# Patient Record
Sex: Male | Born: 1955 | Race: White | Hispanic: No | Smoking: Former smoker
Health system: Southern US, Community
[De-identification: ages and names within clinical notes are randomized; demographics above are authoritative.]

## PROBLEM LIST (undated history)

## (undated) DIAGNOSIS — M1990 Unspecified osteoarthritis, unspecified site: Secondary | ICD-10-CM

## (undated) DIAGNOSIS — K219 Gastro-esophageal reflux disease without esophagitis: Secondary | ICD-10-CM

## (undated) DIAGNOSIS — R079 Chest pain, unspecified: Secondary | ICD-10-CM

## (undated) DIAGNOSIS — I10 Essential (primary) hypertension: Secondary | ICD-10-CM

## (undated) HISTORY — PX: TONSILLECTOMY: SUR1361

## (undated) HISTORY — PX: ADENOIDECTOMY: SUR15

## (undated) HISTORY — PX: OTHER SURGICAL HISTORY: SHX169

## (undated) HISTORY — DX: Chest pain, unspecified: R07.9

---

## 2007-07-07 ENCOUNTER — Encounter: Admission: RE | Admit: 2007-07-07 | Discharge: 2007-07-07 | Payer: Self-pay | Admitting: Family Medicine

## 2009-01-12 ENCOUNTER — Ambulatory Visit (HOSPITAL_BASED_OUTPATIENT_CLINIC_OR_DEPARTMENT_OTHER): Admission: RE | Admit: 2009-01-12 | Discharge: 2009-01-12 | Payer: Self-pay | Admitting: Orthopedic Surgery

## 2010-02-26 ENCOUNTER — Encounter: Payer: Self-pay | Admitting: Family Medicine

## 2011-05-23 ENCOUNTER — Other Ambulatory Visit: Payer: Self-pay | Admitting: Family Medicine

## 2011-05-23 DIAGNOSIS — R911 Solitary pulmonary nodule: Secondary | ICD-10-CM

## 2011-05-24 ENCOUNTER — Ambulatory Visit
Admission: RE | Admit: 2011-05-24 | Discharge: 2011-05-24 | Disposition: A | Discharge status: Home / Self Care | Payer: BC Managed Care – PPO | Source: Ambulatory Visit | Attending: Family Medicine | Admitting: Family Medicine

## 2011-05-24 DIAGNOSIS — R911 Solitary pulmonary nodule: Secondary | ICD-10-CM

## 2012-07-08 ENCOUNTER — Other Ambulatory Visit: Payer: Self-pay | Admitting: Orthopedic Surgery

## 2012-07-08 MED ORDER — DEXAMETHASONE SODIUM PHOSPHATE 10 MG/ML IJ SOLN: 10.0000 mg | Freq: Once | INTRAMUSCULAR | Status: DC

## 2012-07-08 MED ORDER — BUPIVACAINE LIPOSOME 1.3 % IJ SUSP: 20.0000 mL | Freq: Once | INTRAMUSCULAR | Status: DC

## 2012-07-08 NOTE — Progress Notes (Signed)
Preoperative surgical orders have been place into the Epic hospital system for Sean David on 07/08/2012, 5:39 PM  by Patrica Duel for surgery on 07/22/2012.  Preop Total Hip - Anterior Approach orders including Experel Injecion, PO Tylenol, and IV Decadron as long as there are no contraindications to the above medications. Avel Peace, PA-C

## 2012-07-08 NOTE — Progress Notes (Signed)
Surgery scheduled for 07/22/12.  Preop 07/15/12 at 0900am.  Need orders in EPIC.  Thank You.

## 2012-07-09 ENCOUNTER — Encounter (HOSPITAL_COMMUNITY): Payer: Self-pay | Admitting: Pharmacy Technician

## 2012-07-11 ENCOUNTER — Other Ambulatory Visit: Payer: Self-pay | Admitting: Orthopedic Surgery

## 2012-07-14 NOTE — Patient Instructions (Signed)
Cedrick Partain Son  07/14/2012   Your procedure is scheduled on:  07/22/12  Report to [facility] at     1200noon  Call this number if you have problems the morning of surgery: [phone]   Remember:   Do not eat food after midnite.  May have clear liquids until 0830am .     Take these medicines the morning of surgery with A SIP OF WATER:    Do not wear jewelry,   Do not wear lotions, powders, or perfumes.   . Men may shave face and neck.  Do not bring valuables to the hospital.  Contacts, dentures or bridgework may not be worn into surgery.  Leave suitcase in the car. After surgery it may be brought to your room.  For patients admitted to the hospital, checkout time is 11:00 AM the day of  discharge.     SEE CHG INSTRUCTION SHEET    Please read over the following fact sheets that you were given: MRSA Information, coughing and deep breathing exercises, leg exercises, Blood Transfusion Fact sheet, Incentive Spirometry Fact sheet                Failure to comply with these instructions may result in cancellation of your surgery.                Patient Signature ____________________________              Nurse Signature _____________________________

## 2012-07-15 ENCOUNTER — Encounter (HOSPITAL_COMMUNITY): Payer: Self-pay

## 2012-07-15 ENCOUNTER — Ambulatory Visit (HOSPITAL_COMMUNITY)
Admission: RE | Admit: 2012-07-15 | Discharge: 2012-07-15 | Disposition: A | Discharge status: Home / Self Care | Payer: BC Managed Care – PPO | Source: Ambulatory Visit | Attending: Orthopedic Surgery | Admitting: Orthopedic Surgery

## 2012-07-15 ENCOUNTER — Encounter (HOSPITAL_COMMUNITY)
Admission: RE | Admit: 2012-07-15 | Discharge: 2012-07-15 | Disposition: A | Discharge status: Home / Self Care | Payer: BC Managed Care – PPO | Source: Ambulatory Visit | Attending: Orthopedic Surgery | Admitting: Orthopedic Surgery

## 2012-07-15 DIAGNOSIS — Z01812 Encounter for preprocedural laboratory examination: Secondary | ICD-10-CM | POA: Insufficient documentation

## 2012-07-15 DIAGNOSIS — Z0183 Encounter for blood typing: Secondary | ICD-10-CM | POA: Insufficient documentation

## 2012-07-15 DIAGNOSIS — Z01810 Encounter for preprocedural cardiovascular examination: Secondary | ICD-10-CM | POA: Insufficient documentation

## 2012-07-15 DIAGNOSIS — Z01818 Encounter for other preprocedural examination: Secondary | ICD-10-CM | POA: Insufficient documentation

## 2012-07-15 DIAGNOSIS — M169 Osteoarthritis of hip, unspecified: Secondary | ICD-10-CM | POA: Insufficient documentation

## 2012-07-15 DIAGNOSIS — M1610 Unilateral primary osteoarthritis, unspecified hip: Secondary | ICD-10-CM | POA: Insufficient documentation

## 2012-07-15 HISTORY — DX: Essential (primary) hypertension: I10

## 2012-07-15 HISTORY — DX: Unspecified osteoarthritis, unspecified site: M19.90

## 2012-07-15 HISTORY — DX: Gastro-esophageal reflux disease without esophagitis: K21.9

## 2012-07-15 LAB — URINALYSIS, ROUTINE W REFLEX MICROSCOPIC
Bilirubin Urine: NEGATIVE
Protein, ur: NEGATIVE mg/dL
Urobilinogen, UA: 0.2 mg/dL (ref 0.0–1.0)

## 2012-07-15 LAB — CBC
MCH: 31.4 pg (ref 26.0–34.0)
MCHC: 34.3 g/dL (ref 30.0–36.0)
Platelets: 258 10*3/uL (ref 150–400)
RDW: 13.2 % (ref 11.5–15.5)

## 2012-07-15 LAB — URINE MICROSCOPIC-ADD ON

## 2012-07-15 LAB — COMPREHENSIVE METABOLIC PANEL
ALT: 34 U/L (ref 0–53)
AST: 27 U/L (ref 0–37)
Calcium: 9.5 mg/dL (ref 8.4–10.5)
GFR calc Af Amer: >90 mL/min (ref >90)
Glucose, Bld: 101 mg/dL — ABNORMAL HIGH (ref 70–99)
Sodium: 136 mEq/L (ref 135–145)
Total Protein: 7.8 g/dL (ref 6.0–8.3)

## 2012-07-15 LAB — ABO/RH: ABO/RH(D): AB POS

## 2012-07-15 NOTE — Progress Notes (Signed)
Office visit note from PCP dated 06/13/12 on chart.  CXR results done 06/13/12 discussed in notes and EKG discussed but not EKG 12 lead in office record.  Will obtain 12lead EKG on day of surgery .

## 2012-07-15 NOTE — Progress Notes (Signed)
Urinalysis with micro results faxed via EPIC to Dr Aluisio.   

## 2012-07-21 ENCOUNTER — Other Ambulatory Visit: Payer: Self-pay | Admitting: Orthopedic Surgery

## 2012-07-21 NOTE — H&P (Signed)
Sean David  DOB: 05/23/1955 Divorced / Language: English / Race: White Male  Date of Admission:  07/22/2012  Chief Complaint:  Right Hip Pain  History of Present Illness The patient is a 57 year old male who comes in for a preoperative History and Physical. The patient is scheduled for a right total hip arthroplasty (Anterior Approach) to be performed by Dr. Frank V. Aluisio, MD at [facility] on 07/22/2012. The patient is a 57 year old male who presents with a hip problem. The patient reports right lateral hip and right anterior hip problems including pain symptoms that have been present for year(s). The symptoms began without any known injury. Note for "Hip problem": He saw his urologist a year ago, or more, for blood in his urine. He had an xray of his pelvis and the urologist and he told him that he had OA in his hip at that time. He has been putting it off because he stays so busy. He is interested in the total hip, anterior approach. He also reports right knee symptoms including: pain which began 1 year(s) ago without any known injury. Note for "Knee pain": The knee problem started more recently, about a year or so. He said he is afraid of needles and does not wish to have injections. Sean David, David. states the hip has been hurting for many years now. He feels like it is hereditary. His father has undergone bilateral hip replacements and also his grandfather has bilateral hip replacements. Unfortunately, the first hip replacement his father had he developed a foot drop but did okay with the second hip replacement. That is one of the reasons why the patient has been putting off and has gone a long time in dealing with this pain in the hip area. More recently though the pain has developed in the knee. He was concerned that the hip and the knee were bad. The right hip has started to interfere with his mobility. He had decreased range of motion and it is more  difficult getting up and down steps and putting on shoes and socks. He has had loss of motion and it is starting to impact his active lifestyle. It is of note earlier that the patient had x-rays done at his urologist and was told he had horrible arthritis in the hip. The patient understood that the hip has been a problem and he wanted to come in and finally get it evaluated since the pain has been going on for quite some time now. He is now ready to get the hip fixed. At this point, the most predictable means of improving pain and function is total hip arthroplasty. The procedure, risks, potential complications and rehab course are discussed in detail and the patient elects to proceed. The goals of this procedure are decreased pain and increased function. There is a high liklihood that both of these goals will be achieved. They have been treated conservatively in the past for the above stated problem and despite conservative measures, they continue to have progressive pain and severe functional limitations and dysfunction. They have failed non-operative management including home exercise, medications, and injections. It is felt that they would benefit from undergoing total joint replacement. Risks and benefits of the procedure have been discussed with the patient and they elect to proceed with surgery. There are no active contraindications to surgery such as ongoing infection or rapidly progressive neurological disease.   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 he is not allergic to it, he just does not ever want it   Family History  Osteoarthritis. mother Osteoporosis. mother Cerebrovascular Accident. father Diabetes Mellitus. brother and grandmother mothers side   Social History Living situation. live alone Marital status. divorced Number of flights of stairs before winded.  greater than 5 Drug/Alcohol Rehab (Previously). no Exercise. Exercises rarely; does running / walking Illicit drug use. no Pain Contract. no Tobacco / smoke exposure. no Tobacco use. former smoker; smoke(d) 1 pack(s) per day Children. 0 Current work status. retired Drug/Alcohol Rehab (Currently). no Alcohol use. current drinker; drinks beer; 5-7 per week   Medication History Lansoprazole (30MG Capsule DR, Oral) Active. Nadolol (40MG Tablet, Oral) Active. Pravastatin Sodium (40MG Tablet, Oral) Active. Clindamycin HCl (150MG Capsule, Oral) Active. (For Dental Work)   Past Surgical History Tonsillectomy   Medical History Gastroesophageal Reflux Disease High blood pressure Hypercholesterolemia Measles Mumps   Review of Systems  General:Present- Weight Gain. Not Present- Chills, Fever, Night Sweats, Appetite Loss, Fatigue, Feeling sick and Weight Loss. HEENT:Not Present- Sensitivity to light, Hearing problems, Nose Bleed and Ringing in the Ears. Neck:Not Present- Swollen Glands and Neck Mass. Respiratory:Not Present- Snoring, Chronic Cough, Bloody sputum and Dyspnea. Cardiovascular:Not Present- Shortness of Breath, Chest Pain, Swelling of Extremities, Leg Cramps and Palpitations. Gastrointestinal:Present- Heartburn. Not Present- Bloody Stool, Abdominal Pain, Vomiting, Nausea and Incontinence of Stool. Male Genitourinary:Not Present- Blood in Urine, Frequency, Incontinence and Nocturia. Musculoskeletal:Present- Muscle Pain, Joint Stiffness and Joint Pain. Not Present- Muscle Weakness, Joint Swelling and Back Pain. Psychiatric:Not Present- Anxiety, Depression and Memory Loss. Endocrine:Not Present- Cold Intolerance, Heat Intolerance, Excessive hunger and Excessive Thirst. Hematology:Not Present- Abnormal Bleeding, Anemia, Blood Clots and Easy Bruising.   Vitals Weight: 240 lb Height: 73 in Weight was reported by patient. Height was reported by  patient. Body Surface Area: 2.37 m Body Mass Index: 31.66 kg/m Pulse: 60 (Regular) Resp.: 14 (Unlabored) BP: 138/88 (Sitting, Right Arm, Standard)    Physical Exam The physical exam findings are as follows:  Note: Patient is a 57 year old male with continued hip pain.   General Mental Status - Alert, cooperative and good historian. General Appearance- pleasant. Not in acute distress. Orientation- Oriented X3. Build & Nutrition- Well nourished and Well developed.   Head and Neck Head- normocephalic, atraumatic . Neck Global Assessment- supple. no bruit auscultated on the right and no bruit auscultated on the left.   Eye Pupil- Bilateral- Regular and Round. Motion- Bilateral- EOMI.   Chest and Lung Exam Auscultation: Breath sounds:- clear at anterior chest wall and - clear at posterior chest wall. Adventitious sounds:- No Adventitious sounds.   Cardiovascular Auscultation:Rhythm- Regular rate and rhythm. Heart Sounds- S1 WNL and S2 WNL. Murmurs & Other Heart Sounds:Auscultation of the heart reveals - No Murmurs.   Abdomen Inspection:Contour- Generalized mild distention. Palpation/Percussion:Tenderness- Abdomen is non-tender to palpation. Rigidity (guarding)- Abdomen is soft. Auscultation:Auscultation of the abdomen reveals - Bowel sounds normal.   Male Genitourinary Not done, not pertinent to present illness  Musculoskeletal The patient is a 57 -year-old white male, well nourished, well developed in no acute distress, getting off and on the examination table a little slow with his hip.  Examination of both hips, the opposite side left hip shows flexion 130 degrees, internal rotation 10 degrees, external rotation 40 degrees, abduction 45 degrees. He is a little bit limited on the internal rotation otherwise the left side is moving well. Right   hip, reduced flexion of only 110 degrees, zero internal rotation, about 20 degrees  of external rotation, about 30 degrees of abduction, significantly less range of motion. He has some pain noted on attempt at internal rotation which is zero degrees.  Examination of the right knee, he is slightly tender over the lateral joint line. The knee is stable with valgus varus stressing. Passive range of motion is zero to 120 degrees, actively 125 degrees. No effusion. No crepitus. The knee is stable. Extensor mechanism is intact.  RADIOGRAPHS: AP pelvis and lateral view right hip. AP view shows significant dysplasia noted in the right hip with complete loss of the superior joint space in the femoral acetabular joint. He also has flattening of the femoral head with some cystic changes noted. The lateral view confirms the cystic changes in the femoral acetabular region. He has large spurs coming off of the anterior acetabular and also the inferior femoral head.  AP and lateral view right knee show some slight early narrowing of the medial compartment of the right knee otherwise no significant bony abnormalities are appreciated.  Assessment & Plan  Osteoarthrosis of hip (715.35) Impression: Right Hip  Note: Plan is for a Right Total Hip Replacement - Anterior Approach by Dr. Aluisio.  Plan is to go home.  The patient does not have any contraindications and will recieve TXA (tranexamic acid) prior to surgery.  PCP - Dr. Jones ([facility])  Signed electronically by Alexzandrew L Perkins, III PA-C  

## 2012-07-22 ENCOUNTER — Inpatient Hospital Stay (HOSPITAL_COMMUNITY): Payer: BC Managed Care – PPO | Admitting: Anesthesiology

## 2012-07-22 ENCOUNTER — Inpatient Hospital Stay (HOSPITAL_COMMUNITY): Payer: BC Managed Care – PPO

## 2012-07-22 ENCOUNTER — Encounter (HOSPITAL_COMMUNITY)
Admission: RE | Disposition: A | Discharge status: Home Health | Payer: Self-pay | Source: Ambulatory Visit | Attending: Orthopedic Surgery

## 2012-07-22 ENCOUNTER — Encounter (HOSPITAL_COMMUNITY): Payer: Self-pay | Admitting: *Deleted

## 2012-07-22 ENCOUNTER — Inpatient Hospital Stay (HOSPITAL_COMMUNITY)
Admission: RE | Admit: 2012-07-22 | Discharge: 2012-07-24 | DRG: 818 | Disposition: A | Discharge status: Home Health | Payer: BC Managed Care – PPO | Source: Ambulatory Visit | Attending: Orthopedic Surgery | Admitting: Orthopedic Surgery

## 2012-07-22 ENCOUNTER — Encounter (HOSPITAL_COMMUNITY): Payer: Self-pay | Admitting: Anesthesiology

## 2012-07-22 DIAGNOSIS — Z6832 Body mass index (BMI) 32.0-32.9, adult: Secondary | ICD-10-CM | POA: Diagnosis present

## 2012-07-22 DIAGNOSIS — M169 Osteoarthritis of hip, unspecified: Secondary | ICD-10-CM | POA: Diagnosis present

## 2012-07-22 DIAGNOSIS — K219 Gastro-esophageal reflux disease without esophagitis: Secondary | ICD-10-CM | POA: Diagnosis present

## 2012-07-22 DIAGNOSIS — D62 Acute posthemorrhagic anemia: Secondary | ICD-10-CM

## 2012-07-22 DIAGNOSIS — I10 Essential (primary) hypertension: Secondary | ICD-10-CM | POA: Diagnosis present

## 2012-07-22 DIAGNOSIS — M1610 Unilateral primary osteoarthritis, unspecified hip: Principal | ICD-10-CM | POA: Diagnosis present

## 2012-07-22 DIAGNOSIS — E7800 Pure hypercholesterolemia, unspecified: Secondary | ICD-10-CM | POA: Diagnosis present

## 2012-07-22 DIAGNOSIS — Z79899 Other long term (current) drug therapy: Secondary | ICD-10-CM

## 2012-07-22 DIAGNOSIS — Z96649 Presence of unspecified artificial hip joint: Secondary | ICD-10-CM

## 2012-07-22 HISTORY — PX: TOTAL HIP ARTHROPLASTY: SHX124

## 2012-07-22 LAB — TYPE AND SCREEN
ABO/RH(D): AB POS
Antibody Screen: NEGATIVE

## 2012-07-22 SURGERY — ARTHROPLASTY, HIP, TOTAL, ANTERIOR APPROACH
Anesthesia: General | Site: Hip | Laterality: Right | Wound class: Clean

## 2012-07-22 MED ORDER — LACTATED RINGERS IV SOLN
INTRAVENOUS | Status: DC | PRN
  Administered 2012-07-22 (×3): via INTRAVENOUS

## 2012-07-22 MED ORDER — HYDROMORPHONE HCL PF 1 MG/ML IJ SOLN
0.5000 mg | INTRAMUSCULAR | Status: DC | PRN
  Administered 2012-07-22: 1 mg via INTRAVENOUS
  Administered 2012-07-22: 0.5 mg via INTRAVENOUS
  Filled 2012-07-22 (×2): qty 1

## 2012-07-22 MED ORDER — CEFAZOLIN SODIUM-DEXTROSE 2-3 GM-% IV SOLR
INTRAVENOUS | Status: AC
  Filled 2012-07-22: qty 50

## 2012-07-22 MED ORDER — POLYETHYLENE GLYCOL 3350 17 G PO PACK
17.0000 g | PACK | Freq: Every day | ORAL | Status: DC | PRN
  Administered 2012-07-24: 17 g via ORAL

## 2012-07-22 MED ORDER — FENTANYL CITRATE 0.05 MG/ML IJ SOLN
INTRAMUSCULAR | Status: DC | PRN
  Administered 2012-07-22: 50 ug via INTRAVENOUS
  Administered 2012-07-22: 75 ug via INTRAVENOUS
  Administered 2012-07-22: 100 ug via INTRAVENOUS
  Administered 2012-07-22: 50 ug via INTRAVENOUS
  Administered 2012-07-22: 75 ug via INTRAVENOUS
  Administered 2012-07-22: 50 ug via INTRAVENOUS

## 2012-07-22 MED ORDER — DOCUSATE SODIUM 100 MG PO CAPS
100.0000 mg | ORAL_CAPSULE | Freq: Two times a day (BID) | ORAL | Status: DC
  Administered 2012-07-22 – 2012-07-24 (×4): 100 mg via ORAL

## 2012-07-22 MED ORDER — KETOROLAC TROMETHAMINE 15 MG/ML IJ SOLN
15.0000 mg | Freq: Four times a day (QID) | INTRAMUSCULAR | Status: DC | PRN
  Administered 2012-07-22: 15 mg via INTRAVENOUS

## 2012-07-22 MED ORDER — MIDAZOLAM HCL 2 MG/2ML IJ SOLN: 0.5000 mg | Freq: Once | INTRAMUSCULAR | Status: DC | PRN

## 2012-07-22 MED ORDER — CHLORHEXIDINE GLUCONATE 4 % EX LIQD
60.0000 mL | Freq: Once | CUTANEOUS | Status: DC
  Filled 2012-07-22: qty 60

## 2012-07-22 MED ORDER — BUPIVACAINE HCL (PF) 0.25 % IJ SOLN
INTRAMUSCULAR | Status: AC
  Filled 2012-07-22: qty 30

## 2012-07-22 MED ORDER — ACETAMINOPHEN 500 MG PO TABS: 1000.0000 mg | ORAL_TABLET | Freq: Once | ORAL | Status: DC

## 2012-07-22 MED ORDER — OXYCODONE HCL 5 MG PO TABS
5.0000 mg | ORAL_TABLET | ORAL | Status: DC | PRN
  Administered 2012-07-22: 5 mg via ORAL
  Administered 2012-07-22 – 2012-07-23 (×3): 10 mg via ORAL
  Administered 2012-07-24: 5 mg via ORAL
  Filled 2012-07-22 (×3): qty 2
  Filled 2012-07-22 (×2): qty 1

## 2012-07-22 MED ORDER — ACETAMINOPHEN 10 MG/ML IV SOLN
INTRAVENOUS | Status: AC
  Filled 2012-07-22: qty 100

## 2012-07-22 MED ORDER — BUPIVACAINE LIPOSOME 1.3 % IJ SUSP
20.0000 mL | Freq: Once | INTRAMUSCULAR | Status: DC
  Filled 2012-07-22: qty 20

## 2012-07-22 MED ORDER — PHENOL 1.4 % MT LIQD: 1.0000 | OROMUCOSAL | Status: DC | PRN

## 2012-07-22 MED ORDER — LIDOCAINE HCL (CARDIAC) 20 MG/ML IV SOLN
INTRAVENOUS | Status: DC | PRN
  Administered 2012-07-22: 30 mg via INTRAVENOUS

## 2012-07-22 MED ORDER — HYDROMORPHONE HCL PF 1 MG/ML IJ SOLN
0.2500 mg | INTRAMUSCULAR | Status: DC | PRN
  Administered 2012-07-22 (×4): 0.5 mg via INTRAVENOUS

## 2012-07-22 MED ORDER — SODIUM CHLORIDE 0.9 % IV SOLN
INTRAVENOUS | Status: DC
  Administered 2012-07-23: 03:00:00 via INTRAVENOUS

## 2012-07-22 MED ORDER — 0.9 % SODIUM CHLORIDE (POUR BTL) OPTIME
TOPICAL | Status: DC | PRN
  Administered 2012-07-22: 1000 mL

## 2012-07-22 MED ORDER — MIDAZOLAM HCL 5 MG/5ML IJ SOLN
INTRAMUSCULAR | Status: DC | PRN
  Administered 2012-07-22: 1 mg via INTRAVENOUS

## 2012-07-22 MED ORDER — KETOROLAC TROMETHAMINE 15 MG/ML IJ SOLN
INTRAMUSCULAR | Status: AC
  Filled 2012-07-22: qty 1

## 2012-07-22 MED ORDER — LACTATED RINGERS IV SOLN
INTRAVENOUS | Status: DC
  Administered 2012-07-22: 1000 mL via INTRAVENOUS

## 2012-07-22 MED ORDER — ONDANSETRON HCL 4 MG/2ML IJ SOLN
INTRAMUSCULAR | Status: DC | PRN
  Administered 2012-07-22 (×2): 2 mg via INTRAVENOUS

## 2012-07-22 MED ORDER — MENTHOL 3 MG MT LOZG: 1.0000 | LOZENGE | OROMUCOSAL | Status: DC | PRN

## 2012-07-22 MED ORDER — DEXAMETHASONE 6 MG PO TABS
10.0000 mg | ORAL_TABLET | Freq: Every day | ORAL | Status: AC
  Administered 2012-07-23: 10 mg via ORAL
  Filled 2012-07-22: qty 1

## 2012-07-22 MED ORDER — GLYCOPYRROLATE 0.2 MG/ML IJ SOLN
INTRAMUSCULAR | Status: DC | PRN
  Administered 2012-07-22: 0.3 mg via INTRAVENOUS
  Administered 2012-07-22: 0.1 mg via INTRAVENOUS

## 2012-07-22 MED ORDER — METHOCARBAMOL 500 MG PO TABS
500.0000 mg | ORAL_TABLET | Freq: Four times a day (QID) | ORAL | Status: DC | PRN
  Administered 2012-07-22: 500 mg via ORAL
  Filled 2012-07-22: qty 1

## 2012-07-22 MED ORDER — RIVAROXABAN 10 MG PO TABS
10.0000 mg | ORAL_TABLET | Freq: Every day | ORAL | Status: DC
  Administered 2012-07-23 – 2012-07-24 (×2): 10 mg via ORAL
  Filled 2012-07-22 (×3): qty 1

## 2012-07-22 MED ORDER — NADOLOL 40 MG PO TABS
40.0000 mg | ORAL_TABLET | Freq: Every morning | ORAL | Status: DC
  Administered 2012-07-24: 40 mg via ORAL
  Filled 2012-07-22 (×2): qty 1

## 2012-07-22 MED ORDER — EPHEDRINE SULFATE 50 MG/ML IJ SOLN
INTRAMUSCULAR | Status: DC | PRN
  Administered 2012-07-22: 15 mg via INTRAVENOUS
  Administered 2012-07-22 (×2): 10 mg via INTRAVENOUS
  Administered 2012-07-22: 15 mg via INTRAVENOUS
  Administered 2012-07-22: 10 mg via INTRAVENOUS
  Administered 2012-07-22: 15 mg via INTRAVENOUS

## 2012-07-22 MED ORDER — BUPIVACAINE HCL 0.25 % IJ SOLN
INTRAMUSCULAR | Status: DC | PRN
  Administered 2012-07-22: 20 mL

## 2012-07-22 MED ORDER — PROPOFOL 10 MG/ML IV BOLUS
INTRAVENOUS | Status: DC | PRN
  Administered 2012-07-22: 200 mg via INTRAVENOUS

## 2012-07-22 MED ORDER — LACTATED RINGERS IV SOLN
INTRAVENOUS | Status: DC
  Administered 2012-07-22: 17:00:00 via INTRAVENOUS

## 2012-07-22 MED ORDER — DIPHENHYDRAMINE HCL 12.5 MG/5ML PO ELIX
12.5000 mg | ORAL_SOLUTION | ORAL | Status: DC | PRN
  Administered 2012-07-23: 12.5 mg via ORAL
  Filled 2012-07-22: qty 5

## 2012-07-22 MED ORDER — ACETAMINOPHEN 650 MG RE SUPP
650.0000 mg | Freq: Four times a day (QID) | RECTAL | Status: DC
  Filled 2012-07-22 (×8): qty 1

## 2012-07-22 MED ORDER — BISACODYL 10 MG RE SUPP: 10.0000 mg | Freq: Every day | RECTAL | Status: DC | PRN

## 2012-07-22 MED ORDER — SODIUM CHLORIDE 0.9 % IV SOLN: INTRAVENOUS | Status: DC

## 2012-07-22 MED ORDER — PROMETHAZINE HCL 25 MG/ML IJ SOLN: 6.2500 mg | INTRAMUSCULAR | Status: DC | PRN

## 2012-07-22 MED ORDER — TRANEXAMIC ACID 100 MG/ML IV SOLN
1000.0000 mg | INTRAVENOUS | Status: AC
  Administered 2012-07-22: 1000 mg via INTRAVENOUS
  Filled 2012-07-22: qty 10

## 2012-07-22 MED ORDER — SUCCINYLCHOLINE CHLORIDE 20 MG/ML IJ SOLN
INTRAMUSCULAR | Status: DC | PRN
  Administered 2012-07-22: 180 mg via INTRAVENOUS

## 2012-07-22 MED ORDER — MIDAZOLAM HCL 2 MG/2ML IJ SOLN
INTRAMUSCULAR | Status: AC
  Administered 2012-07-22: 1 mg
  Filled 2012-07-22: qty 2

## 2012-07-22 MED ORDER — HYDROMORPHONE HCL PF 1 MG/ML IJ SOLN
INTRAMUSCULAR | Status: AC
  Filled 2012-07-22: qty 1

## 2012-07-22 MED ORDER — ONDANSETRON HCL 4 MG/2ML IJ SOLN: 4.0000 mg | Freq: Four times a day (QID) | INTRAMUSCULAR | Status: DC | PRN

## 2012-07-22 MED ORDER — TRAMADOL HCL 50 MG PO TABS
50.0000 mg | ORAL_TABLET | Freq: Four times a day (QID) | ORAL | Status: DC | PRN
Start: 2012-07-22 — End: 2012-07-24

## 2012-07-22 MED ORDER — DEXAMETHASONE SODIUM PHOSPHATE 10 MG/ML IJ SOLN
10.0000 mg | Freq: Every day | INTRAMUSCULAR | Status: AC
  Filled 2012-07-22: qty 1

## 2012-07-22 MED ORDER — CEFAZOLIN SODIUM-DEXTROSE 2-3 GM-% IV SOLR
2.0000 g | INTRAVENOUS | Status: AC
  Administered 2012-07-22: 2 g via INTRAVENOUS

## 2012-07-22 MED ORDER — METOCLOPRAMIDE HCL 5 MG/ML IJ SOLN
5.0000 mg | Freq: Three times a day (TID) | INTRAMUSCULAR | Status: DC | PRN
Start: 2012-07-22 — End: 2012-07-24

## 2012-07-22 MED ORDER — ACETAMINOPHEN 500 MG PO TABS
1000.0000 mg | ORAL_TABLET | Freq: Four times a day (QID) | ORAL | Status: DC
  Administered 2012-07-22 – 2012-07-24 (×7): 1000 mg via ORAL
  Filled 2012-07-22 (×12): qty 2

## 2012-07-22 MED ORDER — ONDANSETRON HCL 4 MG PO TABS: 4.0000 mg | ORAL_TABLET | Freq: Four times a day (QID) | ORAL | Status: DC | PRN

## 2012-07-22 MED ORDER — PHENYLEPHRINE HCL 10 MG/ML IJ SOLN
INTRAMUSCULAR | Status: DC | PRN
  Administered 2012-07-22 (×2): 20 ug via INTRAVENOUS

## 2012-07-22 MED ORDER — SODIUM CHLORIDE 0.9 % IJ SOLN
INTRAMUSCULAR | Status: DC | PRN
  Administered 2012-07-22: 30 mL via INTRAVENOUS

## 2012-07-22 MED ORDER — CEFAZOLIN SODIUM-DEXTROSE 2-3 GM-% IV SOLR
2.0000 g | Freq: Four times a day (QID) | INTRAVENOUS | Status: AC
  Administered 2012-07-22 – 2012-07-23 (×2): 2 g via INTRAVENOUS
  Filled 2012-07-22 (×2): qty 50

## 2012-07-22 MED ORDER — FLEET ENEMA 7-19 GM/118ML RE ENEM: 1.0000 | ENEMA | Freq: Once | RECTAL | Status: AC | PRN

## 2012-07-22 MED ORDER — CISATRACURIUM BESYLATE (PF) 10 MG/5ML IV SOLN
INTRAVENOUS | Status: DC | PRN
  Administered 2012-07-22: 10 mg via INTRAVENOUS

## 2012-07-22 MED ORDER — METHOCARBAMOL 100 MG/ML IJ SOLN
500.0000 mg | Freq: Four times a day (QID) | INTRAVENOUS | Status: DC | PRN
  Administered 2012-07-22: 500 mg via INTRAVENOUS
  Filled 2012-07-22 (×2): qty 5

## 2012-07-22 MED ORDER — ACETAMINOPHEN 500 MG PO TABS
ORAL_TABLET | ORAL | Status: AC
  Administered 2012-07-22: 1000 mg
  Filled 2012-07-22: qty 2

## 2012-07-22 MED ORDER — KETAMINE HCL 10 MG/ML IJ SOLN
INTRAMUSCULAR | Status: DC | PRN
  Administered 2012-07-22: 20 mg via INTRAVENOUS

## 2012-07-22 MED ORDER — METOCLOPRAMIDE HCL 10 MG PO TABS: 5.0000 mg | ORAL_TABLET | Freq: Three times a day (TID) | ORAL | Status: DC | PRN

## 2012-07-22 MED ORDER — PANTOPRAZOLE SODIUM 40 MG PO TBEC
40.0000 mg | DELAYED_RELEASE_TABLET | Freq: Every day | ORAL | Status: DC
  Filled 2012-07-22: qty 1

## 2012-07-22 MED ORDER — BUPIVACAINE LIPOSOME 1.3 % IJ SUSP
INTRAMUSCULAR | Status: DC | PRN
  Administered 2012-07-22: 20 mL

## 2012-07-22 SURGICAL SUPPLY — 41 items
BAG ZIPLOCK 12X15 (MISCELLANEOUS) ×2 IMPLANT
BLADE SAW SGTL 18X1.27X75 (BLADE) ×2 IMPLANT
CAPT HIP PF COP ×2 IMPLANT
CLOTH BEACON ORANGE TIMEOUT ST (SAFETY) ×2 IMPLANT
CLSR STERI-STRIP ANTIMIC 1/2X4 (GAUZE/BANDAGES/DRESSINGS) IMPLANT
DECANTER SPIKE VIAL GLASS SM (MISCELLANEOUS) ×2 IMPLANT
DRAPE C-ARM 42X120 X-RAY (DRAPES) ×2 IMPLANT
DRAPE STERI IOBAN 125X83 (DRAPES) ×2 IMPLANT
DRAPE U-SHAPE 47X51 STRL (DRAPES) ×6 IMPLANT
DRSG ADAPTIC 3X8 NADH LF (GAUZE/BANDAGES/DRESSINGS) IMPLANT
DRSG MEPILEX BORDER 4X4 (GAUZE/BANDAGES/DRESSINGS) ×2 IMPLANT
DRSG MEPILEX BORDER 4X8 (GAUZE/BANDAGES/DRESSINGS) IMPLANT
DURAPREP 26ML APPLICATOR (WOUND CARE) ×2 IMPLANT
ELECT BLADE 6.5 EXT (BLADE) ×2 IMPLANT
ELECT REM PT RETURN 9FT ADLT (ELECTROSURGICAL) ×2
ELECTRODE REM PT RTRN 9FT ADLT (ELECTROSURGICAL) ×1 IMPLANT
EVACUATOR 1/8 PVC DRAIN (DRAIN) ×2 IMPLANT
FACESHIELD LNG OPTICON STERILE (SAFETY) ×8 IMPLANT
GLOVE BIO SURGEON STRL SZ7.5 (GLOVE) ×2 IMPLANT
GLOVE BIO SURGEON STRL SZ8 (GLOVE) ×4 IMPLANT
GLOVE BIOGEL PI IND STRL 8 (GLOVE) ×2 IMPLANT
GLOVE BIOGEL PI INDICATOR 8 (GLOVE) ×2
GOWN STRL NON-REIN LRG LVL3 (GOWN DISPOSABLE) ×2 IMPLANT
GOWN STRL REIN XL XLG (GOWN DISPOSABLE) ×6 IMPLANT
KIT BASIN OR (CUSTOM PROCEDURE TRAY) ×2 IMPLANT
NDL SAFETY ECLIPSE 18X1.5 (NEEDLE) ×1 IMPLANT
NEEDLE HYPO 18GX1.5 SHARP (NEEDLE) ×1
PACK TOTAL JOINT (CUSTOM PROCEDURE TRAY) ×2 IMPLANT
PADDING CAST COTTON 6X4 STRL (CAST SUPPLIES) ×2 IMPLANT
STRIP CLOSURE SKIN 1/2X4 (GAUZE/BANDAGES/DRESSINGS) ×2 IMPLANT
SUCTION FRAZIER 12FR DISP (SUCTIONS) IMPLANT
SUT ETHIBOND NAB CT1 #1 30IN (SUTURE) ×2 IMPLANT
SUT MNCRL AB 4-0 PS2 18 (SUTURE) ×2 IMPLANT
SUT VIC AB 1 CT1 27 (SUTURE) ×1
SUT VIC AB 1 CT1 27XBRD ANTBC (SUTURE) ×1 IMPLANT
SUT VIC AB 2-0 CT1 27 (SUTURE) ×2
SUT VIC AB 2-0 CT1 TAPERPNT 27 (SUTURE) ×2 IMPLANT
SUT VLOC 180 0 24IN GS25 (SUTURE) ×2 IMPLANT
SYR 50ML LL SCALE MARK (SYRINGE) ×2 IMPLANT
TOWEL OR 17X26 10 PK STRL BLUE (TOWEL DISPOSABLE) ×2 IMPLANT
TRAY FOLEY CATH 14FRSI W/METER (CATHETERS) ×2 IMPLANT

## 2012-07-22 NOTE — Anesthesia Postprocedure Evaluation (Signed)
Anesthesia Post Note  Patient: Sean David  Procedure(s) Performed: Procedure(s) (LRB): TOTAL HIP ARTHROPLASTY ANTERIOR APPROACH (Right)  Anesthesia type: General  Patient location: PACU  Post pain: Pain level controlled  Post assessment: Post-op Vital signs reviewed  Last Vitals:  Filed Vitals:   07/22/12 1818  BP: 135/85  Pulse: 65  Temp: 36.5 C  Resp: 19    Post vital signs: Reviewed  Level of consciousness: sedated  Complications: No apparent anesthesia complications

## 2012-07-22 NOTE — Transfer of Care (Signed)
Immediate Anesthesia Transfer of Care Note  Patient: Sean David  Procedure(s) Performed: Procedure(s): TOTAL HIP ARTHROPLASTY ANTERIOR APPROACH (Right)  Patient Location: PACU  Anesthesia Type:General  Level of Consciousness: awake, alert , oriented and patient cooperative  Airway & Oxygen Therapy: Patient Spontanous Breathing and Patient connected to face mask oxygen  Post-op Assessment: Report given to PACU RN and Post -op Vital signs reviewed and stable  Post vital signs: stable  Complications: No apparent anesthesia complications

## 2012-07-22 NOTE — Anesthesia Preprocedure Evaluation (Signed)
Anesthesia Evaluation  Patient identified by MRN, date of birth, ID band Patient awake    Reviewed: Allergy & Precautions, H&P , NPO status , Patient's Chart, lab work & pertinent test results  Airway Mallampati: III TM Distance: >3 FB Neck ROM: Full    Dental  (+) Poor Dentition, Loose and Caps,    Pulmonary neg pulmonary ROS,  breath sounds clear to auscultation  Pulmonary exam normal       Cardiovascular hypertension, Pt. on medications Rhythm:Regular Rate:Normal     Neuro/Psych negative neurological ROS  negative psych ROS   GI/Hepatic Neg liver ROS, GERD-  Medicated,  Endo/Other  Morbid obesity  Renal/GU negative Renal ROS  negative genitourinary   Musculoskeletal negative musculoskeletal ROS (+)   Abdominal   Peds negative pediatric ROS (+)  Hematology negative hematology ROS (+)   Anesthesia Other Findings   Reproductive/Obstetrics negative OB ROS                           Anesthesia Physical Anesthesia Plan  ASA: III  Anesthesia Plan: General   Post-op Pain Management:    Induction: Intravenous  Airway Management Planned: Oral ETT  Additional Equipment:   Intra-op Plan:   Post-operative Plan: Extubation in OR  Informed Consent: I have reviewed the patients History and Physical, chart, labs and discussed the procedure including the risks, benefits and alternatives for the proposed anesthesia with the patient or authorized representative who has indicated his/her understanding and acceptance.   Dental advisory given  Plan Discussed with: CRNA  Anesthesia Plan Comments:         Anesthesia Quick Evaluation

## 2012-07-22 NOTE — Plan of Care (Signed)
Problem: Consults Goal: Diagnosis- Total Joint Replacement Primary Total Hip     

## 2012-07-22 NOTE — H&P (View-Only) (Signed)
Sean David  DOB: 01-30-1956 Divorced / Language: English / Race: White Male  Date of Admission:  07/22/2012  Chief Complaint:  Right Hip Pain  History of Present Illness The patient is a 57 year old male who comes in for a preoperative History and Physical. The patient is scheduled for a right total hip arthroplasty (Anterior Approach) to be performed by Dr. Gus Rankin. Aluisio, MD at [facility] on 07/22/2012. The patient is a 57 year old male who presents with a hip problem. The patient reports right lateral hip and right anterior hip problems including pain symptoms that have been present for year(s). The symptoms began without any known injury. Note for "Hip problem": He saw his urologist a year ago, or more, for blood in his urine. He had an xray of his pelvis and the urologist and he told him that he had OA in his hip at that time. He has been putting it off because he stays so busy. He is interested in the total hip, anterior approach. He also reports right knee symptoms including: pain which began 1 year(s) ago without any known injury. Note for "Knee pain": The knee problem started more recently, about a year or so. He said he is afraid of needles and does not wish to have injections. Sean Moes, David. states the hip has been hurting for many years now. He feels like it is hereditary. His father has undergone bilateral hip replacements and also his grandfather has bilateral hip replacements. Unfortunately, the first hip replacement his father had he developed a foot drop but did okay with the second hip replacement. That is one of the reasons why the patient has been putting off and has gone a long time in dealing with this pain in the hip area. More recently though the pain has developed in the knee. He was concerned that the hip and the knee were bad. The right hip has started to interfere with his mobility. He had decreased range of motion and it is more  difficult getting up and down steps and putting on shoes and socks. He has had loss of motion and it is starting to impact his active lifestyle. It is of note earlier that the patient had x-rays done at his urologist and was told he had horrible arthritis in the hip. The patient understood that the hip has been a problem and he wanted to come in and finally get it evaluated since the pain has been going on for quite some time now. He is now ready to get the hip fixed. At this point, the most predictable means of improving pain and function is total hip arthroplasty. The procedure, risks, potential complications and rehab course are discussed in detail and the patient elects to proceed. The goals of this procedure are decreased pain and increased function. There is a high liklihood that both of these goals will be achieved. They have been treated conservatively in the past for the above stated problem and despite conservative measures, they continue to have progressive pain and severe functional limitations and dysfunction. They have failed non-operative management including home exercise, medications, and injections. It is felt that they would benefit from undergoing total joint replacement. Risks and benefits of the procedure have been discussed with the patient and they elect to proceed with surgery. There are no active contraindications to surgery such as ongoing infection or rapidly progressive neurological disease.   Problem List Osteoarthrosis of hip (715.35) Primary osteoarthritis of  one knee (715.16)   Allergies  Morphine Sulfate *ANALGESICS - OPIOID*. he is not allergic to it, he just does not ever want it   Family History  Osteoarthritis. mother Osteoporosis. mother Cerebrovascular Accident. father Diabetes Mellitus. brother and grandmother mothers side   Social History Living situation. live alone Marital status. divorced Number of flights of stairs before winded.  greater than 5 Drug/Alcohol Rehab (Previously). no Exercise. Exercises rarely; does running / walking Illicit drug use. no Pain Contract. no Tobacco / smoke exposure. no Tobacco use. former smoker; smoke(d) 1 pack(s) per day Children. 0 Current work status. retired Financial planner (Currently). no Alcohol use. current drinker; drinks beer; 5-7 per week   Medication History Lansoprazole (30MG  Capsule DR, Oral) Active. Nadolol (40MG  Tablet, Oral) Active. Pravastatin Sodium (40MG  Tablet, Oral) Active. Clindamycin HCl (150MG  Capsule, Oral) Active. (For Dental Work)   Past Surgical History Tonsillectomy   Medical History Gastroesophageal Reflux Disease High blood pressure Hypercholesterolemia Measles Mumps   Review of Systems  General:Present- Weight Gain. Not Present- Chills, Fever, Night Sweats, Appetite Loss, Fatigue, Feeling sick and Weight Loss. HEENT:Not Present- Sensitivity to light, Hearing problems, Nose Bleed and Ringing in the Ears. Neck:Not Present- Swollen Glands and Neck Mass. Respiratory:Not Present- Snoring, Chronic Cough, Bloody sputum and Dyspnea. Cardiovascular:Not Present- Shortness of Breath, Chest Pain, Swelling of Extremities, Leg Cramps and Palpitations. Gastrointestinal:Present- Heartburn. Not Present- Bloody Stool, Abdominal Pain, Vomiting, Nausea and Incontinence of Stool. Male Genitourinary:Not Present- Blood in Urine, Frequency, Incontinence and Nocturia. Musculoskeletal:Present- Muscle Pain, Joint Stiffness and Joint Pain. Not Present- Muscle Weakness, Joint Swelling and Back Pain. Psychiatric:Not Present- Anxiety, Depression and Memory Loss. Endocrine:Not Present- Cold Intolerance, Heat Intolerance, Excessive hunger and Excessive Thirst. Hematology:Not Present- Abnormal Bleeding, Anemia, Blood Clots and Easy Bruising.   Vitals Weight: 240 lb Height: 73 in Weight was reported by patient. Height was reported by  patient. Body Surface Area: 2.37 m Body Mass Index: 31.66 kg/m Pulse: 60 (Regular) Resp.: 14 (Unlabored) BP: 138/88 (Sitting, Right Arm, Standard)    Physical Exam The physical exam findings are as follows:  Note: Patient is a 57 year old male with continued hip pain.   General Mental Status - Alert, cooperative and good historian. General Appearance- pleasant. Not in acute distress. Orientation- Oriented X3. Build & Nutrition- Well nourished and Well developed.   Head and Neck Head- normocephalic, atraumatic . Neck Global Assessment- supple. no bruit auscultated on the right and no bruit auscultated on the left.   Eye Pupil- Bilateral- Regular and Round. Motion- Bilateral- EOMI.   Chest and Lung Exam Auscultation: Breath sounds:- clear at anterior chest wall and - clear at posterior chest wall. Adventitious sounds:- No Adventitious sounds.   Cardiovascular Auscultation:Rhythm- Regular rate and rhythm. Heart Sounds- S1 WNL and S2 WNL. Murmurs & Other Heart Sounds:Auscultation of the heart reveals - No Murmurs.   Abdomen Inspection:Contour- Generalized mild distention. Palpation/Percussion:Tenderness- Abdomen is non-tender to palpation. Rigidity (guarding)- Abdomen is soft. Auscultation:Auscultation of the abdomen reveals - Bowel sounds normal.   Male Genitourinary Not done, not pertinent to present illness  Musculoskeletal The patient is a 6 -year-old white male, well nourished, well developed in no acute distress, getting off and on the examination table a little slow with his hip.  Examination of both hips, the opposite side left hip shows flexion 130 degrees, internal rotation 10 degrees, external rotation 40 degrees, abduction 45 degrees. He is a little bit limited on the internal rotation otherwise the left side is moving well. Right  hip, reduced flexion of only 110 degrees, zero internal rotation, about 20 degrees  of external rotation, about 30 degrees of abduction, significantly less range of motion. He has some pain noted on attempt at internal rotation which is zero degrees.  Examination of the right knee, he is slightly tender over the lateral joint line. The knee is stable with valgus varus stressing. Passive range of motion is zero to 120 degrees, actively 125 degrees. No effusion. No crepitus. The knee is stable. Extensor mechanism is intact.  RADIOGRAPHS: AP pelvis and lateral view right hip. AP view shows significant dysplasia noted in the right hip with complete loss of the superior joint space in the femoral acetabular joint. He also has flattening of the femoral head with some cystic changes noted. The lateral view confirms the cystic changes in the femoral acetabular region. He has large spurs coming off of the anterior acetabular and also the inferior femoral head.  AP and lateral view right knee show some slight early narrowing of the medial compartment of the right knee otherwise no significant bony abnormalities are appreciated.  Assessment & Plan  Osteoarthrosis of hip (715.35) Impression: Right Hip  Note: Plan is for a Right Total Hip Replacement - Anterior Approach by Dr. Lequita Halt.  Plan is to go home.  The patient does not have any contraindications and will recieve TXA (tranexamic acid) prior to surgery.  PCP - Dr. Yetta Barre ([facility])  Signed electronically by Lauraine Rinne, III PA-C

## 2012-07-22 NOTE — Interval H&P Note (Signed)
History and Physical Interval Note:  07/22/2012 1:46 PM  Sean David  has presented today for surgery, with the diagnosis of OA OF RIGHT HIP  The various methods of treatment have been discussed with the patient and family. After consideration of risks, benefits and other options for treatment, the patient has consented to  Procedure(s): TOTAL HIP ARTHROPLASTY ANTERIOR APPROACH (Right) as a surgical intervention .  The patient's history has been reviewed, patient examined, no change in status, stable for surgery.  I have reviewed the patient's chart and labs.  Questions were answered to the patient's satisfaction.     Loanne Drilling

## 2012-07-22 NOTE — Op Note (Signed)
OPERATIVE REPORT  PREOPERATIVE DIAGNOSIS: Osteoarthritis of the Right hip.   POSTOPERATIVE DIAGNOSIS: Osteoarthritis of the Right  hip.   PROCEDURE: Right total hip arthroplasty, anterior approach.   SURGEON: Ollen Gross, MD   ASSISTANT: Avel Peace, PA-C  ANESTHESIA:  General  ESTIMATED BLOOD LOSS:- 400 ml  DRAINS: Hemovac x1.   COMPLICATIONS: None   CONDITION: PACU - hemodynamically stable.   BRIEF CLINICAL NOTE: Sean David is a 57 y.o. male who has advanced end-  stage arthritis of his Right  hip with progressively worsening pain and  dysfunction.The patient has failed nonoperative management and presents for  total hip arthroplasty.   PROCEDURE IN DETAIL: After successful administration of spinal  anesthetic, the traction boots for the [facility] bed were placed on both  feet and the patient was placed onto the [facility] bed, boots placed into the leg  holders. The Right hip was then isolated from the perineum with plastic  drapes and prepped and draped in the usual sterile fashion. ASIS and  greater trochanter were marked and a oblique incision was made, starting  at about 1 cm lateral and 2 cm distal to the ASIS and coursing towards  the anterior cortex of the femur. The skin was cut with a 10 blade  through subcutaneous tissue to the level of the fascia overlying the  tensor fascia lata muscle. The fascia was then incised in line with the  incision at the junction of the anterior third and posterior 2/3rd. The  muscle was teased off the fascia and then the interval between the TFL  and the rectus was developed. The Hohmann retractor was then placed at  the top of the femoral neck over the capsule. The vessels overlying the  capsule were cauterized and the fat on top of the capsule was removed.  A Hohmann retractor was then placed anterior underneath the rectus  femoris to give exposure to the entire anterior capsule. A T-shaped  capsulotomy was performed.  The edges were tagged and the femoral head  was identified.       Osteophytes are removed off the superior acetabulum.  The femoral neck was then cut in situ with an oscillating saw. Traction  was then applied to the left lower extremity utilizing the [facility]  traction. The femoral head was then removed. Retractors were placed  around the acetabulum and then circumferential removal of the labrum was  performed. Osteophytes were also removed. Reaming starts at 47 mm to  medialize and  Increased in 2 mm increments to 53 mm. We reamed in  approximately 40 degrees of abduction, 20 degrees anteversion. A 54 mm  pinnacle acetabular shell was then impacted in anatomic position under  fluoroscopic guidance with excellent purchase. We did not need to place  any additional dome screws. A 36 mm neutral + 4 marathon liner was then  placed into the acetabular shell.       The femoral lift was then placed along the lateral aspect of the femur  just distal to the vastus ridge. The leg was  externally rotated and capsule  was stripped off the inferior aspect of the femoral neck down to the  level of the lesser trochanter, this was done with electrocautery. The femur was lifted after this was performed. The  leg was then placed and extended in adducted position to essentially delivering the femur. We also removed the capsule superiorly and the  piriformis from the piriformis  fossa to gain excellent exposure of the  proximal femur. Rongeur was used to remove some cancellous bone to get  into the lateral portion of the proximal femur for placement of the  initial starter reamer. The starter broaches was placed  the starter broach  and was shown to go down the center of the canal. Broaching  with the  Corail system was then performed starting at size 8, coursing  Up to size 13. A size 13 had excellent torsional and rotational  and axial stability. The trial high offset neck was then placed  with a 36 + 5 trial  head. The hip was then reduced. We confirmed that  the stem was in the canal both on AP and lateral x-rays. It also has excellent sizing. The hip was reduced with outstanding stability through full extension, full external rotation,  and then flexion in adduction internal rotation. AP pelvis was taken  and the leg lengths were measured and found to be exactly equal. Hip  was then dislocated again and the femoral head and neck removed. The  femoral broach was removed. Size 13 Corail stem with a high offset  neck was then impacted into the femur following native anteversion. Has  excellent purchase in the canal. Excellent torsional and rotational and  axial stability. It is confirmed to be in the canal on AP and lateral  fluoroscopic views. The 36 mm + 5 ceramic head was placed and the hip  reduced with outstanding stability. Again AP pelvis was taken and it  confirmed that the leg lengths were equal. The wound was then copiously  irrigated with saline solution and the capsule reattached and repaired  with Ethibond suture.  20 mL of Exparel mixed with 50 mL of saline injected  into the capsule and into the edge of the tensor fascia lata as well as  subcutaneous tissue. The fascia overlying the tensor fascia lata was  then closed with a running #1 V-Loc. Subcu was closed with interrupted  2-0 Vicryl and subcuticular running 4-0 Monocryl. Incision was cleaned  and dried. Steri-Strips and a bulky sterile dressing applied. Hemovac  drain was hooked to suction and then he was awakened and transported to  recovery in stable condition.        Please note that a surgical assistant was a medical necessity for this procedure to perform it in a safe and expeditious manner. Assistant was necessary to provide appropriate retraction of vital neurovascular structures and to prevent femoral fracture and allow for anatomic placement of the prosthesis.  Ollen Gross, M.D.

## 2012-07-23 ENCOUNTER — Encounter (HOSPITAL_COMMUNITY): Payer: Self-pay | Admitting: Orthopedic Surgery

## 2012-07-23 DIAGNOSIS — D62 Acute posthemorrhagic anemia: Secondary | ICD-10-CM

## 2012-07-23 LAB — BASIC METABOLIC PANEL
Calcium: 9.1 mg/dL (ref 8.4–10.5)
GFR calc Af Amer: >90 mL/min (ref >90)
GFR calc non Af Amer: >90 mL/min (ref >90)
Potassium: 4.5 mEq/L (ref 3.5–5.1)
Sodium: 135 mEq/L (ref 135–145)

## 2012-07-23 LAB — CBC
MCH: 30.1 pg (ref 26.0–34.0)
MCHC: 32.4 g/dL (ref 30.0–36.0)
Platelets: 194 10*3/uL (ref 150–400)

## 2012-07-23 MED ORDER — NON FORMULARY: 30.0000 mg | Freq: Every day | Status: DC

## 2012-07-23 MED ORDER — LANSOPRAZOLE 30 MG PO CPDR
30.0000 mg | DELAYED_RELEASE_CAPSULE | Freq: Every day | ORAL | Status: DC
  Administered 2012-07-23: 30 mg
  Filled 2012-07-23 (×2): qty 1

## 2012-07-23 NOTE — Care Management Note (Signed)
    Page 1 of 1   07/23/2012     1:55:24 PM   CARE MANAGEMENT NOTE 07/23/2012  Patient:  Sean David, Sean David   Account Number:  [id number]  Date Initiated:  07/23/2012  Documentation initiated by:  Colleen Can  Subjective/Objective Assessment:   dx anterior rt hip replacemnt     Action/Plan:   CM spoke with patient. Plans are for him to return to his home in [place] where his sister will give him support. He already has standard walker, cane, grabber and toliet riser.   Anticipated DC Date:  07/25/2012   Anticipated DC Plan:  HOME W HOME HEALTH SERVICES      DC Planning Services  CM consult      [facility] Choice  HOME HEALTH   Choice offered to / List presented to:  C-1 Patient        HH arranged  HH-2 PT      [facility] agency  [facility]   Status of service:  In process, will continue to follow Medicare Important Message given?   (If response is "NO", the following Medicare IM given date fields will be blank) Date Medicare IM given:   Date Additional Medicare IM given:    Discharge Disposition:    Per UR Regulation:  Reviewed for med. necessity/level of care/duration of stay  If discussed at Long Length of Stay Meetings, dates discussed:    Comments:

## 2012-07-23 NOTE — Progress Notes (Signed)
   Subjective: 1 Day Post-Op Procedure(s) (LRB): TOTAL HIP ARTHROPLASTY ANTERIOR APPROACH (Right) Patient reports pain as mild.   Patient seen in rounds with Dr. Lequita Halt. Patient is well, and has had no acute complaints or problems We will start therapy today.  Plan is to go Home after hospital stay.  Objective: Vital signs in last 24 hours: Temp:  [96.5 F (35.8 C)-97.8 F (36.6 C)] 97.6 F (36.4 C) (06/18 0454) Pulse Rate:  [54-72] 59 (06/18 0613) Resp:  [12-19] 16 (06/18 0800) BP: (108-147)/(69-102) 108/69 mmHg (06/18 0613) SpO2:  [95 %-100 %] 100 % (06/18 0800) Weight:  [110.678 kg (244 lb)] 110.678 kg (244 lb) (06/17 1910)  Intake/Output from previous day:  Intake/Output Summary (Last 24 hours) at 07/23/12 0834 Last data filed at 07/23/12 0981  Gross per 24 hour  Intake   3468 ml  Output   2525 ml  Net    943 ml    Intake/Output this shift:    Labs:  Recent Labs  07/23/12 0435  HGB 12.8*    Recent Labs  07/23/12 0435  WBC 10.4  RBC 4.25  HCT 39.5  PLT 194    Recent Labs  07/23/12 0435  NA 135  K 4.5  CL 101  CO2 28  BUN 12  CREATININE 0.80  GLUCOSE 120*  CALCIUM 9.1   No results found for this basename: LABPT, INR,  in the last 72 hours  EXAM General - Patient is Alert, Appropriate and Oriented Extremity - Neurovascular intact Sensation intact distally Dorsiflexion/Plantar flexion intact Dressing - dressing C/D/I Motor Function - intact, moving foot and toes well on exam.  Hemovac pulled without difficulty.  Past Medical History  Diagnosis Date  . Hypertension   . GERD (gastroesophageal reflux disease)   . Arthritis     Assessment/Plan: 1 Day Post-Op Procedure(s) (LRB): TOTAL HIP ARTHROPLASTY ANTERIOR APPROACH (Right) Principal Problem:   OA (osteoarthritis) of hip Active Problems:   Postoperative anemia due to acute blood loss  Estimated body mass index is 32.2 kg/(m^2) as calculated from the following:   Height as of  this encounter: 6\' 1"  (1.854 m).   Weight as of this encounter: 110.678 kg (244 lb). Advance diet Up with therapy Plan for discharge tomorrow Discharge home with home health  DVT Prophylaxis - Xarelto Weight Bearing As Tolerated right Leg Hemovac Pulled Begin Therapy No vaccines.  Patrica Duel 07/23/2012, 8:34 AM

## 2012-07-23 NOTE — Progress Notes (Signed)
Physical Therapy Treatment Patient Details Name: Sean David MRN: [id number] DOB: [birth year] Today's Date: 07/23/2012 Time: 4098-1191 PT Time Calculation (min): 29 min  PT Assessment / Plan / Recommendation Comments on Treatment Session       Follow Up Recommendations  Home health PT     Does the patient have the potential to tolerate intense rehabilitation     Barriers to Discharge        Equipment Recommendations  None recommended by PT    Recommendations for Other Services OT consult  Frequency 7X/week   Plan Discharge plan remains appropriate    Precautions / Restrictions Precautions Precautions: Fall Restrictions Weight Bearing Restrictions: No Other Position/Activity Restrictions: WBAT   Pertinent Vitals/Pain 4/10; premed, ice pack provided    Mobility  Bed Mobility Bed Mobility: Sit to Supine;Supine to Sit Supine to Sit: 4: Min assist Sit to Supine: 4: Min assist;4: Min guard Details for Bed Mobility Assistance: cues for sequence and use of L LE to self assist - repeated x 2 with pt self assisting R LE with L LE Transfers Transfers: Sit to Stand;Stand to Sit Sit to Stand: 4: Min assist Stand to Sit: 4: Min assist Details for Transfer Assistance: cues for LE management and use of UEs to self assist Ambulation/Gait Ambulation/Gait Assistance: 4: Min assist;4: Min Government social research officer (Feet): 260 Feet Assistive device: Standard walker Ambulation/Gait Assistance Details: cues for posture, sequence and position from RW Gait Pattern: Step-to pattern Stairs: No    Exercises     PT Diagnosis:    PT Problem List:   PT Treatment Interventions:     PT Goals Acute Rehab PT Goals PT Goal Formulation: With patient Time For Goal Achievement: 07/30/12 Potential to Achieve Goals: Good Pt will go Supine/Side to Sit: with modified independence PT Goal: Supine/Side to Sit - Progress: Progressing toward goal Pt will go Sit to Supine/Side: with modified  independence PT Goal: Sit to Supine/Side - Progress: Progressing toward goal Pt will go Sit to Stand: with modified independence PT Goal: Sit to Stand - Progress: Progressing toward goal Pt will go Stand to Sit: with modified independence PT Goal: Stand to Sit - Progress: Progressing toward goal Pt will Ambulate: >150 feet;with modified independence;with standard walker PT Goal: Ambulate - Progress: Progressing toward goal  Visit Information  Last PT Received On: 07/23/12 Assistance Needed: +1    Subjective Data  Patient Stated Goal: Resume previous lifestyle with decreased pain   Cognition  Cognition Arousal/Alertness: Awake/alert Behavior During Therapy: WFL for tasks assessed/performed Overall Cognitive Status: Within Functional Limits for tasks assessed    Balance     End of Session PT - End of Session Activity Tolerance: Patient tolerated treatment well Patient left: in bed;with call bell/phone within reach Nurse Communication: Mobility status   GP     Tyner Codner 07/23/2012, 3:38 PM

## 2012-07-23 NOTE — Evaluation (Signed)
Physical Therapy Evaluation Patient Details Name: Sean David MRN: [id number] DOB: 1955-07-11 Today's Date: 07/23/2012 Time: 4098-1191 PT Time Calculation (min): 42 min  PT Assessment / Plan / Recommendation Clinical Impression  Pt s/p R THR presents with decreased R LE strength/ROM and post op pain limiting functional mobility    PT Assessment  Patient needs continued PT services    Follow Up Recommendations  Home health PT    Does the patient have the potential to tolerate intense rehabilitation      Barriers to Discharge Decreased caregiver support      Equipment Recommendations  None recommended by PT    Recommendations for Other Services OT consult   Frequency 7X/week    Precautions / Restrictions Precautions Precautions: Fall Restrictions Weight Bearing Restrictions: No Other Position/Activity Restrictions: WBAT   Pertinent Vitals/Pain 3/10; premed, ice packs provided      Mobility  Bed Mobility Bed Mobility: Supine to Sit Supine to Sit: 4: Min assist Details for Bed Mobility Assistance: cues for sequence and use of L LE to self assist Transfers Transfers: Sit to Stand;Stand to Sit Sit to Stand: 4: Min assist Stand to Sit: 4: Min assist Details for Transfer Assistance: cues for LE management and use of UEs to self assist Ambulation/Gait Ambulation/Gait Assistance: 4: Min assist Ambulation Distance (Feet): 100 Feet Assistive device: Rolling walker Ambulation/Gait Assistance Details: cues for initial sequence, posture and position from RW Gait Pattern: Step-to pattern Stairs: No    Exercises Total Joint Exercises Ankle Circles/Pumps: AROM;Both;15 reps;Supine Quad Sets: AROM;10 reps;Both;Supine Heel Slides: AAROM;15 reps;Right;Supine Hip ABduction/ADduction: AAROM;10 reps;Supine;Right   PT Diagnosis: Difficulty walking  PT Problem List: Decreased strength;Decreased range of motion;Decreased activity tolerance;Decreased mobility;Decreased  knowledge of use of DME;Pain PT Treatment Interventions: DME instruction;Gait training;Stair training;Functional mobility training;Therapeutic activities;Therapeutic exercise;Patient/family education   PT Goals Acute Rehab PT Goals PT Goal Formulation: With patient Time For Goal Achievement: 07/30/12 Potential to Achieve Goals: Good Pt will go Supine/Side to Sit: with modified independence PT Goal: Supine/Side to Sit - Progress: Goal set today Pt will go Sit to Supine/Side: with modified independence PT Goal: Sit to Supine/Side - Progress: Goal set today Pt will go Sit to Stand: with modified independence PT Goal: Sit to Stand - Progress: Goal set today Pt will go Stand to Sit: with modified independence PT Goal: Stand to Sit - Progress: Goal set today Pt will Ambulate: >150 feet;with modified independence;with standard walker PT Goal: Ambulate - Progress: Goal set today  Visit Information  Last PT Received On: 07/23/12 Assistance Needed: +1    Subjective Data  Subjective: I have been putting this surgery off for years so I was pretty bad off Patient Stated Goal: Resume previous lifestyle with decreased pain   Prior Functioning  Home Living Lives With: Alone Available Help at Discharge: Friend(s);Family Type of Home: House Home Access: Ramped entrance Home Layout: One level Home Adaptive Equipment: Bedside commode/3-in-1;Walker - standard Additional Comments: Pt will likely not have 24/7 assist Prior Function Level of Independence: Independent Able to Take Stairs?: Yes Driving: Yes Vocation: Retired Musician: No difficulties Dominant Hand: Right    Cognition  Cognition Arousal/Alertness: Awake/alert Behavior During Therapy: WFL for tasks assessed/performed Overall Cognitive Status: Within Functional Limits for tasks assessed    Extremity/Trunk Assessment Right Upper Extremity Assessment RUE ROM/Strength/Tone: [facility] for tasks assessed Left Upper  Extremity Assessment LUE ROM/Strength/Tone: WFL for tasks assessed Right Lower Extremity Assessment RLE ROM/Strength/Tone: Deficits RLE ROM/Strength/Tone Deficits: Hip strength 3-/5 with AAROM at  hip to 100 flex and 25 abd Left Lower Extremity Assessment LLE ROM/Strength/Tone: WFL for tasks assessed Trunk Assessment Trunk Assessment: Normal   Balance    End of Session PT - End of Session Equipment Utilized During Treatment: Gait belt Activity Tolerance: Patient tolerated treatment well Patient left: in chair;with call bell/phone within reach Nurse Communication: Mobility status  GP     Sean David 07/23/2012, 12:29 PM

## 2012-07-24 LAB — CBC
HCT: 39.3 % (ref 39.0–52.0)
Hemoglobin: 12.9 g/dL — ABNORMAL LOW (ref 13.0–17.0)
MCH: 29.9 pg (ref 26.0–34.0)
MCHC: 32.8 g/dL (ref 30.0–36.0)
MCV: 91.2 fL (ref 78.0–100.0)
Platelets: 223 10*3/uL (ref 150–400)
RBC: 4.31 MIL/uL (ref 4.22–5.81)
RDW: 13.2 % (ref 11.5–15.5)
WBC: 15.4 10*3/uL — ABNORMAL HIGH (ref 4.0–10.5)

## 2012-07-24 LAB — BASIC METABOLIC PANEL
CO2: 28 mEq/L (ref 19–32)
Calcium: 9.4 mg/dL (ref 8.4–10.5)
Chloride: 104 mEq/L (ref 96–112)
Potassium: 4.6 mEq/L (ref 3.5–5.1)
Sodium: 140 mEq/L (ref 135–145)

## 2012-07-24 MED ORDER — RIVAROXABAN 10 MG PO TABS: 10.0000 mg | ORAL_TABLET | Freq: Every day | ORAL | Status: DC

## 2012-07-24 MED ORDER — METHOCARBAMOL 500 MG PO TABS: 500.0000 mg | ORAL_TABLET | Freq: Four times a day (QID) | ORAL | Status: DC | PRN

## 2012-07-24 MED ORDER — TRAMADOL HCL 50 MG PO TABS: 50.0000 mg | ORAL_TABLET | Freq: Four times a day (QID) | ORAL | Status: DC | PRN

## 2012-07-24 MED ORDER — OXYCODONE HCL 5 MG PO TABS: 5.0000 mg | ORAL_TABLET | ORAL | Status: DC | PRN

## 2012-07-24 NOTE — Progress Notes (Signed)
Discharge summary sent to payer through [facility]  

## 2012-07-24 NOTE — Progress Notes (Signed)
Pt to d/c home with [place] home health. No DME needs. AVS reviewed and "My Chart" discussed with pt. Pt capable of verbalizing medications and follow-up appointments. Remains hemodynamically stable. No signs and symptoms of distress. Educated pt to return to ER in the case of SOB, dizziness, or chest pain.

## 2012-07-24 NOTE — Progress Notes (Signed)
   Subjective: 2 Days Post-Op Procedure(s) (LRB): TOTAL HIP ARTHROPLASTY ANTERIOR APPROACH (Right) Patient reports pain as mild.   Patient seen in rounds with Dr. Lequita Halt. Patient is well, and has had no acute complaints or problems Patient is ready to go home today.  Objective: Vital signs in last 24 hours: Temp:  [97.7 F (36.5 C)-98.5 F (36.9 C)] 97.7 F (36.5 C) (06/19 0510) Pulse Rate:  [56-75] 59 (06/19 0510) Resp:  [14-18] 18 (06/19 0752) BP: (104-134)/(67-85) 134/85 mmHg (06/19 0510) SpO2:  [93 %-96 %] 96 % (06/19 0510)  Intake/Output from previous day:  Intake/Output Summary (Last 24 hours) at 07/24/12 0828 Last data filed at 07/24/12 0750  Gross per 24 hour  Intake 1452.34 ml  Output   5300 ml  Net -3847.66 ml    Intake/Output this shift: Total I/O In: 76.7 [I.V.:76.7] Out: 400 [Urine:400]  Labs:  Recent Labs  07/23/12 0435 07/24/12 0450  HGB 12.8* 12.9*    Recent Labs  07/23/12 0435 07/24/12 0450  WBC 10.4 15.4*  RBC 4.25 4.31  HCT 39.5 39.3  PLT 194 223    Recent Labs  07/23/12 0435 07/24/12 0450  NA 135 140  K 4.5 4.6  CL 101 104  CO2 28 28  BUN 12 10  CREATININE 0.80 0.81  GLUCOSE 120* 126*  CALCIUM 9.1 9.4   No results found for this basename: LABPT, INR,  in the last 72 hours  EXAM: General - Patient is Alert, Appropriate and Oriented Extremity - Neurovascular intact Sensation intact distally Dorsiflexion/Plantar flexion intact No cellulitis present Incision - clean, dry, no drainage, healing Motor Function - intact, moving foot and toes well on exam.   Assessment/Plan: 2 Days Post-Op Procedure(s) (LRB): TOTAL HIP ARTHROPLASTY ANTERIOR APPROACH (Right) Procedure(s) (LRB): TOTAL HIP ARTHROPLASTY ANTERIOR APPROACH (Right) Past Medical History  Diagnosis Date  . Hypertension   . GERD (gastroesophageal reflux disease)   . Arthritis    Principal Problem:   OA (osteoarthritis) of hip Active Problems:   Postoperative  anemia due to acute blood loss  Estimated body mass index is 32.2 kg/(m^2) as calculated from the following:   Height as of this encounter: 6\' 1"  (1.854 m).   Weight as of this encounter: 110.678 kg (244 lb). Discharge home with home health Diet - Cardiac diet Follow up - in 2 weeks Activity - WBAT Disposition - Home Condition Upon Discharge - Good D/C Meds - See DC Summary DVT Prophylaxis - Xarelto  PERKINS, ALEXZANDREW 07/24/2012, 8:28 AM

## 2012-07-24 NOTE — Evaluation (Signed)
Occupational Therapy Evaluation Patient Details Name: Sean David MRN: [id number] DOB: 18-Dec-1955 Today's Date: 07/24/2012 Time: 4098-1191 OT Time Calculation (min): 20 min  OT Assessment / Plan / Recommendation Clinical Impression  This 57 year old man was admitted for R anterior direct THA.  All education was completed.  Pt does not need any further OT.    OT Assessment       Follow Up Recommendations  No OT follow up    Barriers to Discharge      Equipment Recommendations  None recommended by OT    Recommendations for Other Services    Frequency       Precautions / Restrictions Precautions Precautions: Fall Restrictions Weight Bearing Restrictions: No Other Position/Activity Restrictions: WBAT   Pertinent Vitals/Pain C/o soreness/burning at incision site    ADL  Equipment Used: Reacher;Rolling walker;Sock aid Transfers/Ambulation Related to ADLs: pt ambulated to bathroom at supervision level and performed shower transfer ADL Comments: pt dressed himself with set up.  Has reacher.  Educated on sock aid, although he doesn't plan to use this.  Educated not to push through pain but to use AE for independence.  Pt also has a long shoehorn Simulated transfer to 3:1 (bed), supervision level   OT Diagnosis:    OT Problem List:   OT Treatment Interventions:     OT Goals    Visit Information  Last OT Received On: 07/24/12 Assistance Needed: +1    Subjective Data  Subjective: I've been planning ahead.  I won't wear socks and I have food piled up Patient Stated Goal: home.  Wants to minimize inconvenience on anyone helping him   Prior Functioning     Home Living Lives With: Alone Available Help at Discharge: Friend(s);Family Bathroom Shower/Tub: Health visitor: Standard Home Adaptive Equipment: Bedside commode/3-in-1;Walker - standard Prior Function Level of Independence: Independent         Vision/Perception     Cognition  Cognition Arousal/Alertness: Awake/alert Behavior During Therapy: WFL for tasks assessed/performed Overall Cognitive Status: Within Functional Limits for tasks assessed    Extremity/Trunk Assessment Right Upper Extremity Assessment RUE ROM/Strength/Tone: WFL for tasks assessed Left Upper Extremity Assessment LUE ROM/Strength/Tone: WFL for tasks assessed     Mobility Transfers Sit to Stand: 5: Supervision;From bed Details for Transfer Assistance: no cues needed     Exercise     Balance     End of Session OT - End of Session Activity Tolerance: Patient tolerated treatment well Patient left: in bed;with call bell/phone within reach  GO     Sean David 07/24/2012, 8:57 AM Marica Otter, OTR/L [phone] 07/24/2012

## 2012-07-24 NOTE — Discharge Summary (Signed)
Physician Discharge Summary   Patient ID: Sean David MRN: [id number] DOB/AGE: 1955-03-20 57 y.o.  Admit date: 07/22/2012 Discharge date: 07/24/2012  Primary Diagnosis:  Osteoarthritis of the Right hip.   Admission Diagnoses:  Past Medical History  Diagnosis Date  . Hypertension   . GERD (gastroesophageal reflux disease)   . Arthritis    Discharge Diagnoses:   Principal Problem:   OA (osteoarthritis) of hip Active Problems:   Postoperative anemia due to acute blood loss  Estimated body mass index is 32.2 kg/(m^2) as calculated from the following:   Height as of this encounter: 6\' 1"  (1.854 m).   Weight as of this encounter: 110.678 kg (244 lb).  Procedure(s) (LRB): TOTAL HIP ARTHROPLASTY ANTERIOR APPROACH (Right)   Consults: None  HPI: Sean David is a 57 y.o. male who has advanced end-  stage arthritis of his Right hip with progressively worsening pain and  dysfunction.The patient has failed nonoperative management and presents for  total hip arthroplasty.   Laboratory Data: Admission on 07/22/2012  Component Date Value Range Status  . WBC 07/23/2012 10.4  4.0 - 10.5 K/uL Final  . RBC 07/23/2012 4.25  4.22 - 5.81 MIL/uL Final  . Hemoglobin 07/23/2012 12.8* 13.0 - 17.0 g/dL Final  . HCT 40/98/1191 39.5  39.0 - 52.0 % Final  . MCV 07/23/2012 92.9  78.0 - 100.0 fL Final  . MCH 07/23/2012 30.1  26.0 - 34.0 pg Final  . MCHC 07/23/2012 32.4  30.0 - 36.0 g/dL Final  . RDW 47/82/9562 13.5  11.5 - 15.5 % Final  . Platelets 07/23/2012 194  150 - 400 K/uL Final  . Sodium 07/23/2012 135  135 - 145 mEq/L Final  . Potassium 07/23/2012 4.5  3.5 - 5.1 mEq/L Final  . Chloride 07/23/2012 101  96 - 112 mEq/L Final  . CO2 07/23/2012 28  19 - 32 mEq/L Final  . Glucose, Bld 07/23/2012 120* 70 - 99 mg/dL Final  . BUN 13/09/6576 12  6 - 23 mg/dL Final  . Creatinine, Ser 07/23/2012 0.80  0.50 - 1.35 mg/dL Final  . Calcium 46/96/2952 9.1  8.4 - 10.5 mg/dL Final  . GFR calc  non Af Amer 07/23/2012 >90  >90 mL/min Final  . GFR calc Af Amer 07/23/2012 >90  >90 mL/min Final   Comment:                                 The eGFR has been calculated                          using the CKD EPI equation.                          This calculation has not been                          validated in all clinical                          situations.                          eGFR's persistently                          <  90 mL/min signify                          possible Chronic Kidney Disease.  . WBC 07/24/2012 15.4* 4.0 - 10.5 K/uL Final  . RBC 07/24/2012 4.31  4.22 - 5.81 MIL/uL Final  . Hemoglobin 07/24/2012 12.9* 13.0 - 17.0 g/dL Final  . HCT 16/11/9602 39.3  39.0 - 52.0 % Final  . MCV 07/24/2012 91.2  78.0 - 100.0 fL Final  . MCH 07/24/2012 29.9  26.0 - 34.0 pg Final  . MCHC 07/24/2012 32.8  30.0 - 36.0 g/dL Final  . RDW 54/10/8117 13.2  11.5 - 15.5 % Final  . Platelets 07/24/2012 223  150 - 400 K/uL Final  . Sodium 07/24/2012 140  135 - 145 mEq/L Final  . Potassium 07/24/2012 4.6  3.5 - 5.1 mEq/L Final  . Chloride 07/24/2012 104  96 - 112 mEq/L Final  . CO2 07/24/2012 28  19 - 32 mEq/L Final  . Glucose, Bld 07/24/2012 126* 70 - 99 mg/dL Final  . BUN 14/78/2956 10  6 - 23 mg/dL Final  . Creatinine, Ser 07/24/2012 0.81  0.50 - 1.35 mg/dL Final  . Calcium 21/30/8657 9.4  8.4 - 10.5 mg/dL Final  . GFR calc non Af Amer 07/24/2012 >90  >90 mL/min Final  . GFR calc Af Amer 07/24/2012 >90  >90 mL/min Final   Comment:                                 The eGFR has been calculated                          using the CKD EPI equation.                          This calculation has not been                          validated in all clinical                          situations.                          eGFR's persistently                          <90 mL/min signify                          possible Chronic Kidney Disease.  Hospital Outpatient Visit on 07/15/2012  Component  Date Value Range Status  . aPTT 07/15/2012 31  24 - 37 seconds Final  . WBC 07/15/2012 9.5  4.0 - 10.5 K/uL Final  . RBC 07/15/2012 5.03  4.22 - 5.81 MIL/uL Final  . Hemoglobin 07/15/2012 15.8  13.0 - 17.0 g/dL Final  . HCT 84/69/6295 46.0  39.0 - 52.0 % Final  . MCV 07/15/2012 91.5  78.0 - 100.0 fL Final  . MCH 07/15/2012 31.4  26.0 - 34.0 pg Final  . MCHC 07/15/2012 34.3  30.0 - 36.0 g/dL Final  . RDW 28/41/3244 13.2  11.5 - 15.5 % Final  . Platelets 07/15/2012 258  150 - 400 K/uL Final  . Sodium 07/15/2012 136  135 - 145 mEq/L Final  . Potassium 07/15/2012 4.5  3.5 - 5.1 mEq/L Final  . Chloride 07/15/2012 102  96 - 112 mEq/L Final  . CO2 07/15/2012 27  19 - 32 mEq/L Final  . Glucose, Bld 07/15/2012 101* 70 - 99 mg/dL Final  . BUN 96/05/5407 14  6 - 23 mg/dL Final  . Creatinine, Ser 07/15/2012 0.86  0.50 - 1.35 mg/dL Final  . Calcium 81/19/1478 9.5  8.4 - 10.5 mg/dL Final  . Total Protein 07/15/2012 7.8  6.0 - 8.3 g/dL Final  . Albumin 29/56/2130 4.0  3.5 - 5.2 g/dL Final  . AST 86/57/8469 27  0 - 37 U/L Final  . ALT 07/15/2012 34  0 - 53 U/L Final  . Alkaline Phosphatase 07/15/2012 94  39 - 117 U/L Final  . Total Bilirubin 07/15/2012 0.2* 0.3 - 1.2 mg/dL Final  . GFR calc non Af Amer 07/15/2012 >90  >90 mL/min Final  . GFR calc Af Amer 07/15/2012 >90  >90 mL/min Final   Comment:                                 The eGFR has been calculated                          using the CKD EPI equation.                          This calculation has not been                          validated in all clinical                          situations.                          eGFR's persistently                          <90 mL/min signify                          possible Chronic Kidney Disease.  Marland Kitchen Prothrombin Time 07/15/2012 12.4  11.6 - 15.2 seconds Final  . INR 07/15/2012 0.93  0.00 - 1.49 Final  . ABO/RH(D) 07/15/2012 AB POS   Final  . Antibody Screen 07/15/2012 NEG   Final  . Sample  Expiration 07/15/2012 07/25/2012   Final  . Color, Urine 07/15/2012 YELLOW  YELLOW Final  . APPearance 07/15/2012 CLEAR  CLEAR Final  . Specific Gravity, Urine 07/15/2012 1.017  1.005 - 1.030 Final  . pH 07/15/2012 5.0  5.0 - 8.0 Final  . Glucose, UA 07/15/2012 NEGATIVE  NEGATIVE mg/dL Final  . Hgb urine dipstick 07/15/2012 TRACE* NEGATIVE Final  . Bilirubin Urine 07/15/2012 NEGATIVE  NEGATIVE Final  . Ketones, ur 07/15/2012 NEGATIVE  NEGATIVE mg/dL Final  . Protein, ur 62/95/2841 NEGATIVE  NEGATIVE mg/dL Final  . Urobilinogen, UA 07/15/2012 0.2  0.0 - 1.0 mg/dL Final  . Nitrite 32/44/0102 NEGATIVE  NEGATIVE Final  . Leukocytes, UA 07/15/2012 NEGATIVE  NEGATIVE Final  . MRSA, PCR 07/15/2012 NEGATIVE  NEGATIVE  Final  . Staphylococcus aureus 07/15/2012 NEGATIVE  NEGATIVE Final   Comment:                                 The Xpert SA Assay (FDA                          approved for NASAL specimens                          in patients over 23 years of age),                          is one component of                          a comprehensive surveillance                          program.  Test performance has                          been validated by [employer] for patients greater                          than or equal to 47 year old.                          It is not intended                          to diagnose infection nor to                          guide or monitor treatment.  . ABO/RH(D) 07/15/2012 AB POS   Final  . Squamous Epithelial / LPF 07/15/2012 RARE  RARE Final  . RBC / HPF 07/15/2012 0-2  <3 RBC/hpf Final  . Bacteria, UA 07/15/2012 RARE  RARE Final     X-Rays:Dg Hip Complete Right  07/15/2012   *RADIOLOGY REPORT*  Clinical Data: Osteoarthritis right hip, preoperative assessment  RIGHT HIP - COMPLETE 2+ VIEW  Comparison: None  Findings: Mild osseous demineralization. Symmetric SI joints. Minimal narrowing of left hip joint. Marked narrowing of  right hip joint space with bone-on-bone appearance, subchondral cystic changes at the acetabulum and femoral head, and slight superolateral subluxation of the right femoral head. No acute fracture or dislocation identified. Osseous pelvis appears intact.  IMPRESSION: Advanced osteoarthritic changes of the right hip joint.   Original Report Authenticated By: Ulyses Southward, M.D.   Dg Pelvis Portable  07/22/2012   *RADIOLOGY REPORT*  Clinical Data: Postop right total hip arthroplasty  PORTABLE PELVIS  Comparison: CT abdomen pelvis 05/09/2008  Findings: There are immediate postop changes of total right hip arthroplasty.  Soft tissue drain projects lateral to the proximal right femur.  No hardware complication or periprosthetic fracture is identified. Superior aspects of the iliac bones excluded from the image.  Left hip is unremarkable.  IMPRESSION: Right total hip  arthroplasty.  No complicating feature identified.   Original Report Authenticated By: Britta Mccreedy, M.D.   Dg C-arm 1-60 Min-no Report  07/22/2012   CLINICAL DATA: surgery   C-ARM 1-60 MINUTES  Fluoroscopy was utilized by the requesting physician.  No radiographic  interpretation.     EKG: Orders placed during the hospital encounter of 07/22/12  . EKG 12-LEAD  . EKG 12-LEAD     Hospital Course: Patient was admitted to [facility] and taken to the OR and underwent the above state procedure without complications.  Patient tolerated the procedure well and was later transferred to the recovery room and then to the orthopaedic floor for postoperative care.  They were given PO and IV analgesics for pain control following their surgery.  They were given 24 hours of postoperative antibiotics of  Anti-infectives   Start     Dose/Rate Route Frequency Ordered Stop   07/23/12 0600  ceFAZolin (ANCEF) IVPB 2 g/50 mL premix     2 g 100 mL/hr over 30 Minutes Intravenous On call to O.R. 07/22/12 1223 07/22/12 1442   07/22/12 2000  ceFAZolin (ANCEF)  IVPB 2 g/50 mL premix     2 g 100 mL/hr over 30 Minutes Intravenous Every 6 hours 07/22/12 1820 07/23/12 0309     and started on DVT prophylaxis in the form of Xarelto.   PT and OT were ordered for total hip protocol.  The patient was allowed to be WBAT with therapy. Discharge planning was consulted to help with postop disposition and equipment needs.  Patient had a good night on the evening of surgery.  They started to get up OOB with therapy on day one.  Hemovac drain was pulled without difficulty.  The knee immobilizer was removed and discontinued.  Continued to work with therapy into day two.  Dressing was changed on day two and the incision was healing well. Patient was seen in rounds and was ready to go home.   Discharge Medications: Prior to Admission medications   Medication Sig Start Date End Date Taking? Authorizing Provider  lansoprazole (PREVACID) 30 MG capsule Take 30 mg by mouth every morning.   Yes Historical Provider, MD  nadolol (CORGARD) 40 MG tablet Take 40 mg by mouth every morning.   Yes Historical Provider, MD  pravastatin (PRAVACHOL) 40 MG tablet Take 40 mg by mouth at bedtime.   Yes Historical Provider, MD  methocarbamol (ROBAXIN) 500 MG tablet Take 1 tablet (500 mg total) by mouth every 6 (six) hours as needed. 07/24/12   Caylea Foronda, PA-C  oxyCODONE (OXY IR/ROXICODONE) 5 MG immediate release tablet Take 1-2 tablets (5-10 mg total) by mouth every 3 (three) hours as needed. 07/24/12   Janalyn Higby Julien Girt, PA-C  rivaroxaban (XARELTO) 10 MG TABS tablet Take 1 tablet (10 mg total) by mouth daily with breakfast. Take Xarelto for two and a half more weeks, then discontinue Xarelto. 07/24/12   Jakarie Pember, PA-C  traMADol (ULTRAM) 50 MG tablet Take 1-2 tablets (50-100 mg total) by mouth every 6 (six) hours as needed (mild pain). 07/24/12   Kalvyn Desa Julien Girt, PA-C    Diet: Cardiac diet Activity:WBAT Follow-up:in 2 weeks Disposition - Home Discharged Condition:  good   Discharge Orders   Future Orders Complete By Expires     Call MD / Call 911  As directed     Comments:      If you experience chest pain or shortness of breath, CALL 911 and be transported to the hospital emergency  room.  If you develope a fever above 101 F, pus (white drainage) or increased drainage or redness at the wound, or calf pain, call your surgeon's office.    Change dressing  As directed     Comments:      You may change your dressing dressing daily with sterile 4 x 4 inch gauze dressing and paper tape.  Do not submerge the incision under water.    Constipation Prevention  As directed     Comments:      Drink plenty of fluids.  Prune juice may be helpful.  You may use a stool softener, such as Colace (over the counter) 100 mg twice a day.  Use MiraLax (over the counter) for constipation as needed.    Diet - low sodium heart healthy  As directed     Discharge instructions  As directed     Comments:      Pick up stool softner and laxative for home. Do not submerge incision under water. May shower. Continue to use ice for pain and swelling from surgery. Total Hip Protocol.  Take Xarelto for two and a half more weeks, then discontinue Xarelto.    Do not sit on low chairs, stoools or toilet seats, as it may be difficult to get up from low surfaces  As directed     Driving restrictions  As directed     Comments:      No driving until released by the physician.    Increase activity slowly as tolerated  As directed     Lifting restrictions  As directed     Comments:      No lifting until released by the physician.    Patient may shower  As directed     Comments:      You may shower without a dressing once there is no drainage.  Do not wash over the wound.  If drainage remains, do not shower until drainage stops.    TED hose  As directed     Comments:      Use stockings (TED hose) for 3 weeks on both leg(s).  You may remove them at night for sleeping.    Weight bearing  as tolerated  As directed         Medication List    STOP taking these medications       clindamycin 150 MG capsule  Commonly known as:  CLEOCIN      TAKE these medications       lansoprazole 30 MG capsule  Commonly known as:  PREVACID  Take 30 mg by mouth every morning.     methocarbamol 500 MG tablet  Commonly known as:  ROBAXIN  Take 1 tablet (500 mg total) by mouth every 6 (six) hours as needed.     nadolol 40 MG tablet  Commonly known as:  CORGARD  Take 40 mg by mouth every morning.     oxyCODONE 5 MG immediate release tablet  Commonly known as:  Oxy IR/ROXICODONE  Take 1-2 tablets (5-10 mg total) by mouth every 3 (three) hours as needed.     pravastatin 40 MG tablet  Commonly known as:  PRAVACHOL  Take 40 mg by mouth at bedtime.     rivaroxaban 10 MG Tabs tablet  Commonly known as:  XARELTO  Take 1 tablet (10 mg total) by mouth daily with breakfast. Take Xarelto for two and a half more weeks, then discontinue Xarelto.     traMADol  50 MG tablet  Commonly known as:  ULTRAM  Take 1-2 tablets (50-100 mg total) by mouth every 6 (six) hours as needed (mild pain).           Follow-up Information   Follow up with Loanne Drilling, MD. Schedule an appointment as soon as possible for a visit in 2 weeks. (Follow up on either July 1st or 2nd.)    Contact information:   [address], [address], [place] [address] [place] [address] [phone]       Signed: Patrica Duel 07/24/2012, 8:36 AM

## 2012-07-24 NOTE — Progress Notes (Signed)
Physical Therapy Treatment Patient Details Name: Sean David MRN: [id number] DOB: Feb 18, 1955 Today's Date: 07/24/2012 Time: 9147-8295 PT Time Calculation (min): 46 min  PT Assessment / Plan / Recommendation Comments on Treatment Session  Reviewed car transfers with pt    Follow Up Recommendations  Home health PT     Does the patient have the potential to tolerate intense rehabilitation     Barriers to Discharge        Equipment Recommendations  None recommended by PT    Recommendations for Other Services OT consult  Frequency 7X/week   Plan Discharge plan remains appropriate    Precautions / Restrictions Precautions Precautions: Fall Restrictions Weight Bearing Restrictions: No Other Position/Activity Restrictions: WBAT   Pertinent Vitals/Pain 2-3/10; premed, ice pack provided    Mobility  Bed Mobility Bed Mobility: Supine to Sit Supine to Sit: 4: Min guard Details for Bed Mobility Assistance: cues for sequence and use of L LE to self assist - repeated x 2 with pt self assisting R LE with L LE Transfers Transfers: Sit to Stand;Stand to Sit Sit to Stand: 5: Supervision;From bed Stand to Sit: 5: Supervision Details for Transfer Assistance: no cues needed Ambulation/Gait Ambulation/Gait Assistance: 5: Supervision;6: Modified independent (Device/Increase time) Ambulation Distance (Feet): 400 Feet Assistive device: Standard walker Ambulation/Gait Assistance Details: cues for posture and position from RW Gait Pattern: Step-to pattern Stairs: Yes Stairs Assistance: 4: Min assist Stairs Assistance Details (indicate cue type and reason): cues for sequence and foot placement Stair Management Technique: Two rails;Forwards;Step to pattern Number of Stairs: 2    Exercises Total Joint Exercises Ankle Circles/Pumps: AROM;Both;Supine;20 reps Quad Sets: AROM;10 reps;Both;Supine Gluteal Sets: AROM;Both;10 reps;Supine Heel Slides: AAROM;Right;Supine;20 reps Hip  ABduction/ADduction: AAROM;Supine;Right;20 reps   PT Diagnosis:    PT Problem List:   PT Treatment Interventions:     PT Goals Acute Rehab PT Goals PT Goal Formulation: With patient Time For Goal Achievement: 07/30/12 Potential to Achieve Goals: Good Pt will go Supine/Side to Sit: with modified independence PT Goal: Supine/Side to Sit - Progress: Progressing toward goal Pt will go Sit to Supine/Side: with modified independence PT Goal: Sit to Supine/Side - Progress: Progressing toward goal Pt will go Sit to Stand: with modified independence PT Goal: Sit to Stand - Progress: Progressing toward goal Pt will go Stand to Sit: with modified independence PT Goal: Stand to Sit - Progress: Progressing toward goal Pt will Ambulate: >150 feet;with modified independence;with standard walker PT Goal: Ambulate - Progress: Met  Visit Information  Last PT Received On: 07/24/12 Assistance Needed: +1    Subjective Data  Subjective: I don't really trust my knee on that side but what do you think about using a cane Patient Stated Goal: Resume previous lifestyle with decreased pain   Cognition  Cognition Arousal/Alertness: Awake/alert Behavior During Therapy: WFL for tasks assessed/performed Overall Cognitive Status: Within Functional Limits for tasks assessed    Balance     End of Session PT - End of Session Activity Tolerance: Patient tolerated treatment well Patient left: in chair;with call bell/phone within reach Nurse Communication: Mobility status   GP     Merl Guardino 07/24/2012, 12:28 PM

## 2014-03-31 IMAGING — CR DG PORTABLE PELVIS
1 series · 1 of 1 positions shown · non-contrast
Comparison: CT abdomen pelvis 05/09/2008

CLINICAL DATA: Postop right total hip arthroplasty

PORTABLE PELVIS

[AP]
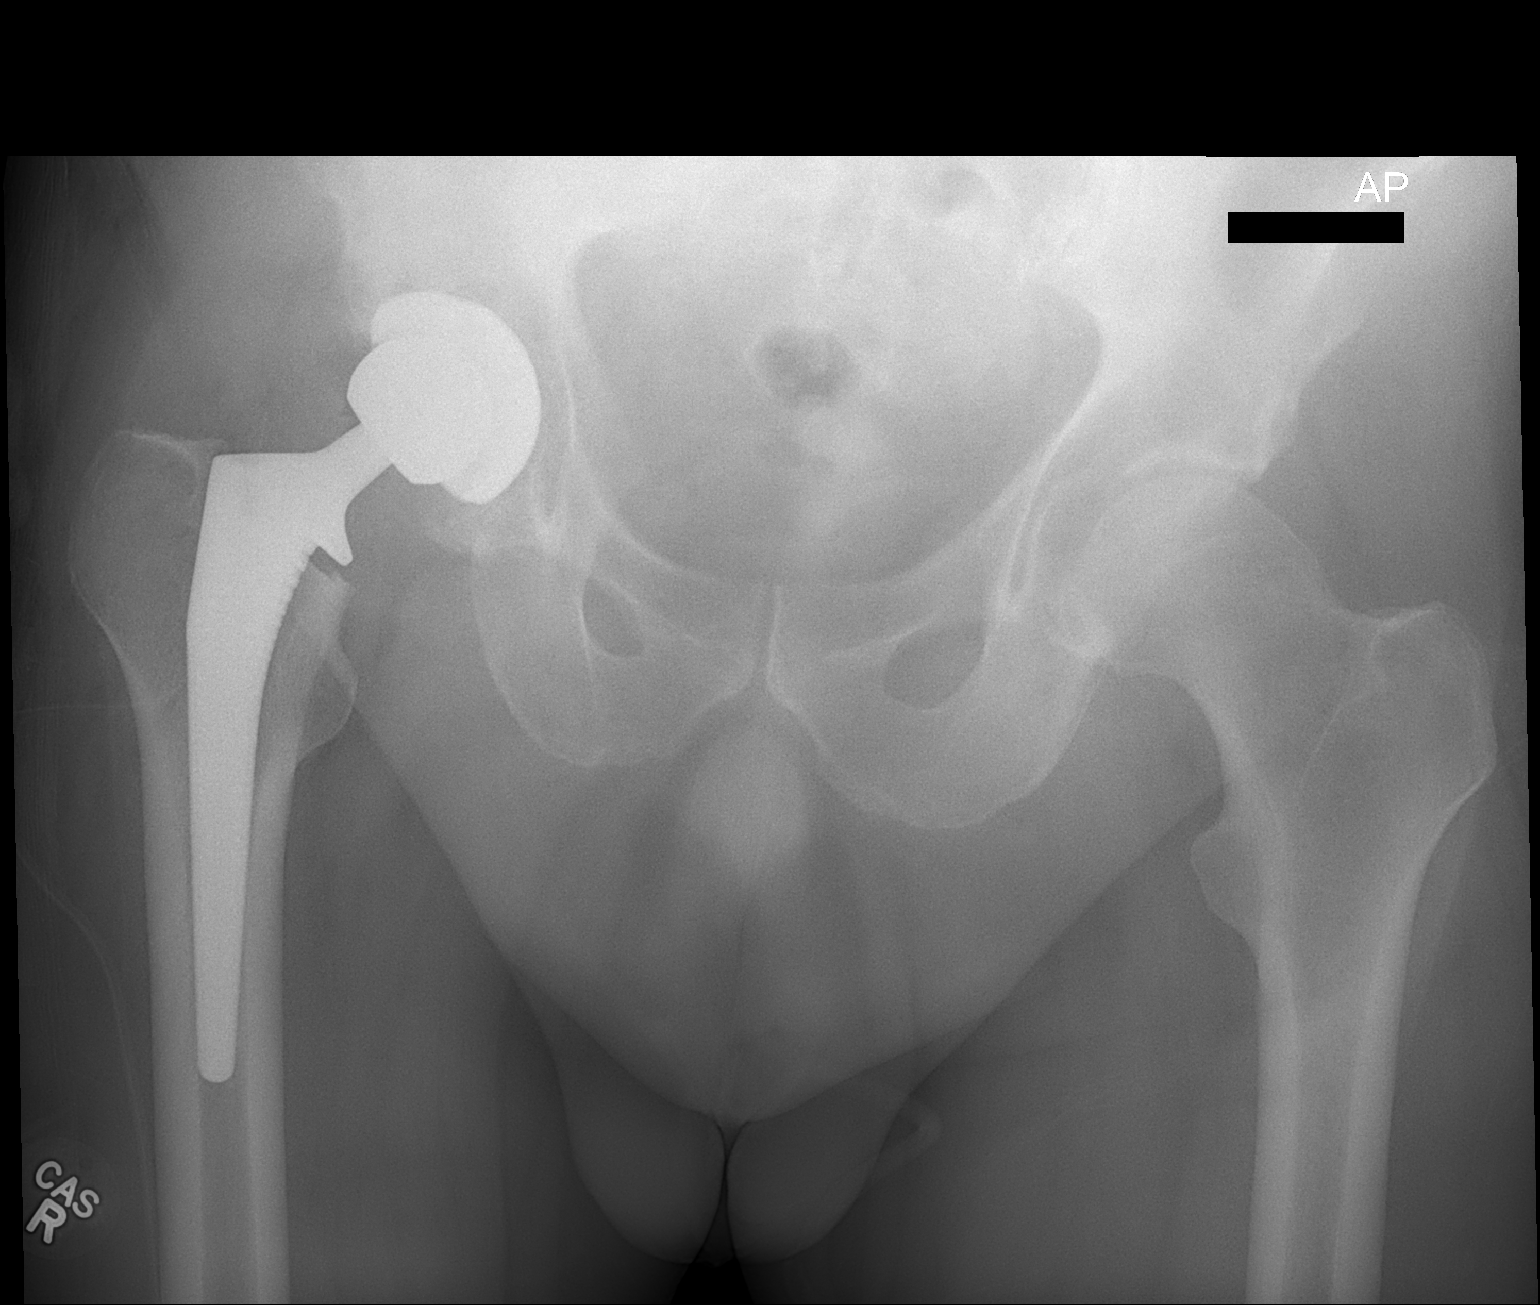

[1 of 1 positions shown; findings below may reference images not displayed]

FINDINGS: There are immediate postop changes of total right hip
arthroplasty.  Soft tissue drain projects lateral to the proximal
right femur.  No hardware complication or periprosthetic fracture
is identified. Superior aspects of the iliac bones excluded from
the image.  Left hip is unremarkable.
IMPRESSION: Right total hip arthroplasty.  No complicating feature identified.

## 2020-01-04 ENCOUNTER — Telehealth: Payer: Self-pay | Admitting: Hematology and Oncology

## 2020-01-04 NOTE — Telephone Encounter (Signed)
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 Mr. Sean David has been cld and scheduled to see Dr. Al Pimple on 12/15 at 10am. Pt aware to arrive 30 minutes early.

## 2020-01-19 NOTE — Progress Notes (Signed)
[name] CONSULT NOTE  Patient Care Team: Diamantina Providence, FNP as PCP - General (Nurse Practitioner)  CHIEF COMPLAINTS/PURPOSE OF CONSULTATION:  Polycythemia.  ASSESSMENT & PLAN:  No problem-specific Assessment & Plan notes found for this encounter.  This is a very pleasant 64 year old male patient with no significant past medical history except for well-controlled hypertension who was referred to hematology for evaluation of polycythemia.  Patient arrived to the appointment today by himself.  He absolutely denies any B symptoms, erythromelalgia, intractable pruritus, hyperviscosity symptoms.  No clear etiology for secondary polycythemia. Physical examination, no palpable lymphadenopathy or hepatosplenomegaly or bilateral lower extremity edema.  I reviewed available labs.  His hemoglobin back in July was 18.3 and his most recent hemoglobin from November is 16.5 We will evaluate him for possible myeloproliferative disorder and primary polycythemia today.  If no labs suggestive of primary polycythemia, he can consider blood donation versus therapeutic phlebotomy intermittently as needed.   He would like to proceed with a telephone visit to review his lab results and to discuss any additional recommendations. Also showed right ankle swelling which has been ongoing for the past month and a half, no pain and no trauma.  I have ordered an x-ray of this and he understands this may need further investigation depending on what we find. All his questions were answered to the best of my knowledge. Thank you for consulting [place] in the care of this patient.  Please do not hesitate to contact us with any additional questions or concerns  Orders Placed This Encounter  Procedures  . DG Ankle Complete Right    Standing Status:   Future    Standing Expiration Date:   01/19/2021    Order Specific Question:   Reason for Exam (SYMPTOM  OR DIAGNOSIS REQUIRED)    Answer:   Right ankle swelling,  unexplained and going on for more than a month.    Order Specific Question:   Preferred imaging location?    Answer:   [facility]  . CBC with Differential/Platelet    Standing Status:   Standing    Number of Occurrences:   22    Standing Expiration Date:   01/19/2021  . Jak 2 V617F ([employer])    Standing Status:   Future    Number of Occurrences:   1    Standing Expiration Date:   01/19/2021  . Calreticulin (CALR) Mutation Analysis    Standing Status:   Future    Number of Occurrences:   1    Standing Expiration Date:   01/19/2021  . MPL mutation analysis    Standing Status:   Future    Number of Occurrences:   1    Standing Expiration Date:   01/19/2021  . Erythropoietin    Standing Status:   Future    Number of Occurrences:   1    Standing Expiration Date:   01/19/2021  . BCR-ABL    With RT-PCR technique    Standing Status:   Standing    Number of Occurrences:   22    Standing Expiration Date:   01/19/2021    HISTORY OF PRESENTING ILLNESS:  Sean David 64 y.o. male is here because of polycythemia.  This is a very pleasant 64 year old man with past medical history significant for hypertension, currently not on any medication who was referred to hematology for evaluation of polycythemia.  Patient denies any fevers, drenching night sweats, loss of appetite or loss of  weight.  He denies any changes in breathing, bowel habits or urinary habits.  He denies any history of thromboembolic disorders, myalgia, intractable pruritus or any hyperviscosity symptoms.  He has never donated blood in the past.  He denies any new diagnosis of sleep apnea problems or heart problems or testosterone supplementation.  No new neurological complaints.  Rest of the pertinent 10 point ROS reviewed and negative  REVIEW OF SYSTEMS:   Constitutional: Denies fevers, chills or abnormal night sweats Eyes: Denies blurriness of vision, double vision or watery eyes Ears, nose, mouth, throat, and  face: Denies mucositis or sore throat Respiratory: Denies cough, dyspnea or wheezes Cardiovascular: Denies palpitation, chest discomfort or lower extremity swelling Gastrointestinal:  Denies nausea, heartburn or change in bowel habits Skin: Denies abnormal skin rashes Lymphatics: Denies new lymphadenopathy or easy bruising Neurological:Denies numbness, tingling or new weaknesses Behavioral/Psych: Mood is stable, no new changes  All other systems were reviewed with the patient and are negative.  MEDICAL HISTORY:  Past Medical History:  Diagnosis Date  . Arthritis   . GERD (gastroesophageal reflux disease)   . Hypertension     SURGICAL HISTORY: Past Surgical History:  Procedure Laterality Date  . ADENOIDECTOMY    . left hand surgery      glass removed from left hand   . TONSILLECTOMY    . 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 Marital status: Divorced    Spouse name: Not on file  . Number of children: Not on file  . Years of education: Not on file  . Highest education level: Not on file  Occupational History  . Not on file  Tobacco Use  . Smoking status: Former Games developer  . Smokeless tobacco: Never Used  Substance and Sexual Activity  . Alcohol use: Yes    Comment: occasional beer   . Drug use: No  . Sexual activity: Not on file  Other Topics Concern  . Not on file  Social History Narrative  . Not on file   Social Determinants of Health   Financial Resource Strain: Not on file  Food Insecurity: Not on file  Transportation Needs: Not on file  Physical Activity: Not on file  Stress: Not on file  Social Connections: Not on file  Intimate Partner Violence: Not on file    FAMILY HISTORY: No family history on file.  ALLERGIES:  is allergic to morphine and  related.  MEDICATIONS:  Current Outpatient Medications  Medication Sig Dispense Refill  . lansoprazole (PREVACID) 30 MG capsule Take 30 mg by mouth every morning.    . pravastatin (PRAVACHOL) 40 MG tablet Take 40 mg by mouth at bedtime.     No current facility-administered medications for this visit.     PHYSICAL EXAMINATION: ECOG PERFORMANCE STATUS: 0 - Asymptomatic  Vitals:   01/20/20 0956  BP: (!) 133/98  Pulse: 72  Resp: 17  Temp: (!) 97.5 F (36.4 C)  SpO2: 99%   Filed Weights   01/20/20 0956  Weight: 232 lb 9.6 oz (105.5 kg)    GENERAL:alert, no distress and comfortable SKIN: skin color, texture, turgor are normal, no rashes or significant lesions EYES: normal, conjunctiva are pink and non-injected, sclera clear OROPHARYNX:no exudate, no erythema and lips, buccal mucosa, and tongue normal  NECK: supple, thyroid normal  size, non-tender, without nodularity LYMPH:  no palpable lymphadenopathy in the cervical, axillary or inguinal LUNGS: clear to auscultation and percussion with normal breathing effort HEART: regular rate & rhythm and no murmurs and no lower extremity edema ABDOMEN:abdomen soft, non-tender and normal bowel sounds Musculoskeletal:no cyanosis of digits and no clubbing  PSYCH: alert & oriented x 3 with fluent speech NEURO: no focal motor/sensory deficits  LABORATORY DATA:  I have reviewed the data as listed Lab Results  Component Value Date   WBC 15.4 (H) 07/24/2012   HGB 12.9 (L) 07/24/2012   HCT 39.3 07/24/2012   MCV 91.2 07/24/2012   PLT 223 07/24/2012     Chemistry      Component Value Date/Time   NA 140 07/24/2012 0450   K 4.6 07/24/2012 0450   CL 104 07/24/2012 0450   CO2 28 07/24/2012 0450   BUN 10 07/24/2012 0450   CREATININE 0.81 07/24/2012 0450      Component Value Date/Time   CALCIUM 9.4 07/24/2012 0450   ALKPHOS 94 07/15/2012 0930   AST 27 07/15/2012 0930   ALT 34 07/15/2012 0930   BILITOT 0.2 (L) 07/15/2012 0930       RADIOGRAPHIC STUDIES: I have personally reviewed the radiological images as listed and agreed with the findings in the report. No results found.  All questions were answered. The patient knows to call the clinic with any problems, questions or concerns. I spent 45 minutes in the care of this patient, please do not hesitate to contact us with any additional questions or concerns,     Rachel Moulds, MD 01/20/2020 11:06 AM

## 2020-01-20 ENCOUNTER — Inpatient Hospital Stay: Payer: Medicare Other | Attending: Hematology and Oncology | Admitting: Hematology and Oncology

## 2020-01-20 ENCOUNTER — Other Ambulatory Visit: Payer: Self-pay

## 2020-01-20 ENCOUNTER — Inpatient Hospital Stay: Payer: Medicare Other

## 2020-01-20 ENCOUNTER — Ambulatory Visit (HOSPITAL_COMMUNITY)
Admission: RE | Admit: 2020-01-20 | Discharge: 2020-01-20 | Disposition: A | Discharge status: Home / Self Care | Payer: Medicare Other | Source: Ambulatory Visit | Attending: Hematology and Oncology | Admitting: Hematology and Oncology

## 2020-01-20 ENCOUNTER — Encounter: Payer: Self-pay | Admitting: Hematology and Oncology

## 2020-01-20 VITALS — BP 133/98 | HR 72 | Temp 97.5°F | Resp 17 | Ht 73.0 in | Wt 232.6 lb

## 2020-01-20 DIAGNOSIS — M25471 Effusion, right ankle: Secondary | ICD-10-CM

## 2020-01-20 DIAGNOSIS — D751 Secondary polycythemia: Secondary | ICD-10-CM | POA: Insufficient documentation

## 2020-01-20 DIAGNOSIS — M129 Arthropathy, unspecified: Secondary | ICD-10-CM | POA: Diagnosis not present

## 2020-01-20 DIAGNOSIS — K219 Gastro-esophageal reflux disease without esophagitis: Secondary | ICD-10-CM | POA: Diagnosis not present

## 2020-01-20 DIAGNOSIS — I10 Essential (primary) hypertension: Secondary | ICD-10-CM | POA: Insufficient documentation

## 2020-01-20 DIAGNOSIS — Z87891 Personal history of nicotine dependence: Secondary | ICD-10-CM | POA: Diagnosis not present

## 2020-01-20 DIAGNOSIS — G4730 Sleep apnea, unspecified: Secondary | ICD-10-CM | POA: Insufficient documentation

## 2020-01-20 DIAGNOSIS — Z79899 Other long term (current) drug therapy: Secondary | ICD-10-CM | POA: Diagnosis not present

## 2020-01-20 DIAGNOSIS — M25473 Effusion, unspecified ankle: Secondary | ICD-10-CM | POA: Insufficient documentation

## 2020-01-20 LAB — CBC WITH DIFFERENTIAL/PLATELET
Abs Immature Granulocytes: 0.03 10*3/uL (ref 0.00–0.07)
Basophils Absolute: 0.1 10*3/uL (ref 0.0–0.1)
Basophils Relative: 2 %
Eosinophils Absolute: 0.2 10*3/uL (ref 0.0–0.5)
Eosinophils Relative: 2 %
HCT: 49.7 % (ref 39.0–52.0)
Hemoglobin: 16.9 g/dL (ref 13.0–17.0)
Immature Granulocytes: 0 %
Lymphocytes Relative: 30 %
Lymphs Abs: 2.3 10*3/uL (ref 0.7–4.0)
MCH: 30.8 pg (ref 26.0–34.0)
MCHC: 34 g/dL (ref 30.0–36.0)
MCV: 90.7 fL (ref 80.0–100.0)
Monocytes Absolute: 0.6 10*3/uL (ref 0.1–1.0)
Monocytes Relative: 8 %
Neutro Abs: 4.5 10*3/uL (ref 1.7–7.7)
Neutrophils Relative %: 58 %
Platelets: 227 10*3/uL (ref 150–400)
RBC: 5.48 MIL/uL (ref 4.22–5.81)
RDW: 13 % (ref 11.5–15.5)
WBC: 7.8 10*3/uL (ref 4.0–10.5)
nRBC: 0 % (ref 0.0–0.2)

## 2020-01-20 NOTE — Progress Notes (Signed)
Sean David  Please tell him that x ray showed soft tissue swelling but no bone abnormality. Please ask him to follow up with his PCP if the swelling doesn't disappear in another 2 weeks.

## 2020-01-21 ENCOUNTER — Telehealth: Payer: Self-pay

## 2020-01-21 LAB — ERYTHROPOIETIN: Erythropoietin: 3.1 m[IU]/mL (ref 2.6–18.5)

## 2020-01-21 NOTE — Telephone Encounter (Signed)
-----   Message from Rachel Moulds, MD sent at 01/20/2020  5:03 PM EST ----- Dorene Grebe  Please tell him that x ray showed soft tissue swelling but no bone abnormality. Please ask him to follow up with his PCP if the swelling doesn't disappear in another 2 weeks.

## 2020-01-21 NOTE — Telephone Encounter (Signed)
Called patient and let him know that per Dr. Al Pimple the x-ray showed soft tissue swelling but no bone abnormality and to follow up with PCP if the swelling does not disappear in another 2 weeks. Patient verbalized understanding.

## 2020-01-26 LAB — MPL MUTATION ANALYSIS

## 2020-01-27 ENCOUNTER — Inpatient Hospital Stay (HOSPITAL_BASED_OUTPATIENT_CLINIC_OR_DEPARTMENT_OTHER): Payer: Medicare Other | Admitting: Hematology and Oncology

## 2020-01-27 ENCOUNTER — Encounter: Payer: Self-pay | Admitting: Hematology and Oncology

## 2020-01-27 DIAGNOSIS — D751 Secondary polycythemia: Secondary | ICD-10-CM | POA: Diagnosis not present

## 2020-01-27 LAB — JAK 2 V617F (GENPATH)

## 2020-01-27 NOTE — Progress Notes (Signed)
[facility] CONSULT NOTE  Patient Care Team: Diamantina Providence, FNP as PCP - General (Nurse Practitioner)  CHIEF COMPLAINTS/PURPOSE OF CONSULTATION:  Polycythemia.  ASSESSMENT & PLAN:  No problem-specific Assessment & Plan notes found for this encounter.  This is a very pleasant 64 year old male patient with no significant past medical history except for well-controlled hypertension who was referred to hematology for evaluation of polycythemia.  Patient arrived to the appointment today by himself.  He absolutely denied any B symptoms, erythromelalgia, intractable pruritus, hyperviscosity symptoms.  No clear etiology for secondary polycythemia. Physical examination, no palpable lymphadenopathy or hepatosplenomegaly or bilateral lower extremity edema.  I reviewed available labs.  His hemoglobin back in July was 18.3 and his most recent hemoglobin from November is 16.5. During his initial visit, we discussed about MPN work up. He is here for a telephone visit to discuss recommendations. MPN work up so far negative, some of it is pending. I encouraged him to stay in touch with his PCP and come back to see [place] if hemoglobin is high again  Then with regards to the ankle swelling, discussed the XR findings. Encouraged him to talk to his PCP for further evaluation, he may need additional imaging to look for soft tissue abnormalities. He expressed understanding, he will contact his PCP. All his questions were answered to the best of my knowledge. Thank you for consulting [place] in the care of this patient.  Please do not hesitate to contact us with any additional questions or concerns  No orders of the defined types were placed in this encounter.   HISTORY OF PRESENTING ILLNESS:  Sean David 64 y.o. male is here because of polycythemia.  This is a very pleasant 64 year old man with past medical history significant for hypertension, currently not on any medication who was referred  to hematology for evaluation of polycythemia.  Patient denies any fevers, drenching night sweats, loss of appetite or loss of weight.  He denies any changes in breathing, bowel habits or urinary habits.  He denies any history of thromboembolic disorders, myalgia, intractable pruritus or any hyperviscosity symptoms.  He has never donated blood in the past.  He denies any new diagnosis of sleep apnea problems or heart problems or testosterone supplementation.  No new neurological complaints.   Interim history  No new complaints for me today. He feels well except for pain in the right ankle.  REVIEW OF SYSTEMS:   Constitutional: Denies fevers, chills or abnormal night sweats Eyes: Denies blurriness of vision, double vision or watery eyes Ears, nose, mouth, throat, and face: Denies mucositis or sore throat Respiratory: Denies cough, dyspnea or wheezes Cardiovascular: Denies palpitation, chest discomfort or lower extremity swelling Gastrointestinal:  Denies nausea, heartburn or change in bowel habits Skin: Denies abnormal skin rashes Lymphatics: Denies new lymphadenopathy or easy bruising Neurological:Denies numbness, tingling or new weaknesses Behavioral/Psych: Mood is stable, no new changes  All other systems were reviewed with the patient and are negative.  MEDICAL HISTORY:  Past Medical History:  Diagnosis Date  . Arthritis   . GERD (gastroesophageal reflux disease)   . Hypertension     SURGICAL HISTORY: Past Surgical History:  Procedure Laterality Date  . ADENOIDECTOMY    . left hand surgery      glass removed from left hand   . TONSILLECTOMY    . 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 Marital status: Divorced    Spouse name: Not on file  . Number of children: Not on  file  . Years of education: Not on file  . Highest education level: Not on file  Occupational History  . Not on file  Tobacco Use  . Smoking status: Former Games developer  . Smokeless tobacco: Never Used  Substance and Sexual Activity  . Alcohol use: Yes    Comment: occasional beer   . Drug use: No  . Sexual activity: Not on file  Other Topics Concern  . Not on file  Social History Narrative  . Not on file   Social Determinants of Health   Financial Resource Strain: Not on file  Food Insecurity: Not on file  Transportation Needs: Not on file  Physical Activity: Not on file  Stress: Not on file  Social Connections: Not on file  Intimate Partner Violence: Not on file    FAMILY HISTORY: No family history on file.  ALLERGIES:  is allergic to morphine and related.  MEDICATIONS:  Current Outpatient Medications  Medication Sig Dispense Refill  . ibuprofen (ADVIL) 800 MG tablet Take 800 mg by mouth 3 (three) times daily.    . lansoprazole (PREVACID) 30 MG capsule Take 30 mg by mouth every morning.    . pravastatin (PRAVACHOL) 40 MG tablet Take 40 mg by mouth at bedtime.     No current facility-administered medications for this visit.     PHYSICAL EXAMINATION: ECOG PERFORMANCE STATUS: 0 - Asymptomatic  There were no vitals filed for this visit. There were no vitals filed for this visit.  VS and PE not done because of telephone visit.  LABORATORY DATA:  I have reviewed the data as listed Lab Results  Component Value Date   WBC 7.8 01/20/2020   HGB 16.9 01/20/2020   HCT 49.7 01/20/2020   MCV 90.7 01/20/2020   PLT 227 01/20/2020     Chemistry      Component Value Date/Time   NA 140 07/24/2012 0450   K 4.6 07/24/2012 0450   CL 104 07/24/2012 0450   CO2 28 07/24/2012 0450   BUN 10 07/24/2012 0450   CREATININE 0.81 07/24/2012 0450      Component Value Date/Time   CALCIUM 9.4 07/24/2012 0450   ALKPHOS 94 07/15/2012 0930   AST 27 07/15/2012 0930   ALT 34 07/15/2012  0930   BILITOT 0.2 (L) 07/15/2012 0930      RADIOGRAPHIC STUDIES: I have personally reviewed the radiological images as listed and agreed with the findings in the report. DG Ankle Complete Right  Result Date: 01/20/2020 CLINICAL DATA:  Medial ankle swelling EXAM: RIGHT ANKLE - COMPLETE 3+ VIEW COMPARISON:  None. FINDINGS: Asymmetric soft tissue swelling at the medial malleolus. Alignment is anatomic. Joint space is preserved. IMPRESSION: Asymmetric soft tissue swelling at the medial malleolus. No significant osseous abnormality. Electronically Signed   By: Guadlupe Spanish M.D.   On: 01/20/2020 16:35   Reviewed lab results and explained it.  All questions were answered. The patient knows to call the clinic with any problems, questions or concerns. I spent 15 minutes in the care of this patient, please do not hesitate to contact us with any additional questions or concerns,     Rachel Moulds, MD 01/27/2020 1:02 PM

## 2020-01-28 LAB — BCR/ABL

## 2020-01-31 LAB — CALRETICULIN (CALR) MUTATION ANALYSIS

## 2020-06-07 ENCOUNTER — Other Ambulatory Visit: Payer: Self-pay | Admitting: General Surgery

## 2020-10-03 ENCOUNTER — Emergency Department (HOSPITAL_COMMUNITY)
Admission: EM | Admit: 2020-10-03 | Discharge: 2020-10-04 | Disposition: A | Discharge status: Home / Self Care | Payer: Medicare Other | Attending: Emergency Medicine | Admitting: Emergency Medicine

## 2020-10-03 ENCOUNTER — Emergency Department (HOSPITAL_COMMUNITY): Payer: Medicare Other

## 2020-10-03 ENCOUNTER — Other Ambulatory Visit: Payer: Self-pay

## 2020-10-03 DIAGNOSIS — Z96651 Presence of right artificial knee joint: Secondary | ICD-10-CM | POA: Insufficient documentation

## 2020-10-03 DIAGNOSIS — Z87891 Personal history of nicotine dependence: Secondary | ICD-10-CM | POA: Diagnosis not present

## 2020-10-03 DIAGNOSIS — R0789 Other chest pain: Secondary | ICD-10-CM

## 2020-10-03 DIAGNOSIS — I493 Ventricular premature depolarization: Secondary | ICD-10-CM | POA: Insufficient documentation

## 2020-10-03 DIAGNOSIS — Z79899 Other long term (current) drug therapy: Secondary | ICD-10-CM | POA: Insufficient documentation

## 2020-10-03 DIAGNOSIS — I10 Essential (primary) hypertension: Secondary | ICD-10-CM | POA: Diagnosis not present

## 2020-10-03 DIAGNOSIS — R079 Chest pain, unspecified: Secondary | ICD-10-CM | POA: Diagnosis present

## 2020-10-03 LAB — BASIC METABOLIC PANEL
Anion gap: 7 (ref 5–15)
BUN: 19 mg/dL (ref 8–23)
CO2: 26 mmol/L (ref 22–32)
Calcium: 9.6 mg/dL (ref 8.9–10.3)
Chloride: 107 mmol/L (ref 98–111)
Creatinine, Ser: 0.99 mg/dL (ref 0.61–1.24)
GFR, Estimated: >60 mL/min (ref >60)
Glucose, Bld: 104 mg/dL — ABNORMAL HIGH (ref 70–99)
Potassium: 4.3 mmol/L (ref 3.5–5.1)
Sodium: 140 mmol/L (ref 135–145)

## 2020-10-03 LAB — CBC WITH DIFFERENTIAL/PLATELET
Abs Immature Granulocytes: 0.03 10*3/uL (ref 0.00–0.07)
Basophils Absolute: 0.2 10*3/uL — ABNORMAL HIGH (ref 0.0–0.1)
Basophils Relative: 2 %
Eosinophils Absolute: 0.5 10*3/uL (ref 0.0–0.5)
Eosinophils Relative: 5 %
HCT: 51.3 % (ref 39.0–52.0)
Hemoglobin: 17.5 g/dL — ABNORMAL HIGH (ref 13.0–17.0)
Immature Granulocytes: 0 %
Lymphocytes Relative: 26 %
Lymphs Abs: 2.4 10*3/uL (ref 0.7–4.0)
MCH: 32.2 pg (ref 26.0–34.0)
MCHC: 34.1 g/dL (ref 30.0–36.0)
MCV: 94.5 fL (ref 80.0–100.0)
Monocytes Absolute: 0.8 10*3/uL (ref 0.1–1.0)
Monocytes Relative: 9 %
Neutro Abs: 5.4 10*3/uL (ref 1.7–7.7)
Neutrophils Relative %: 58 %
Platelets: 229 10*3/uL (ref 150–400)
RBC: 5.43 MIL/uL (ref 4.22–5.81)
RDW: 13.5 % (ref 11.5–15.5)
WBC: 9.3 10*3/uL (ref 4.0–10.5)
nRBC: 0 % (ref 0.0–0.2)

## 2020-10-03 LAB — TROPONIN I (HIGH SENSITIVITY)
Troponin I (High Sensitivity): 5 ng/L (ref <18)
Troponin I (High Sensitivity): 4 ng/L (ref <18)

## 2020-10-03 NOTE — ED Triage Notes (Signed)
Pt arrives from PCP office for eval of ongoing L sided chest pain for a few weeks. Sent here for further eval after abnormal ekg. Endorses mild shob.

## 2020-10-03 NOTE — ED Provider Notes (Signed)
Emergency Medicine Provider Triage Evaluation Note  Sean David , a 65 y.o. male  was evaluated in triage. Sent from PCP for abnormal ekg.  Pt complains of left sided chest pain for 2 weeks.   Review of Systems  Positive: Cp,  sob Negative: Palpit, dizziness, leg swell  Physical Exam  BP 132/76 (BP Location: Right Arm)   Pulse 63   Temp 98.3 F (36.8 C) (Oral)   Resp 14   SpO2 96%  Gen:   Awake, no distress   Resp:  Normal effort  MSK:   Moves extremities without difficulty  Other:    Medical Decision Making  Medically screening exam initiated at 3:57 PM.  Appropriate orders placed.  Sean David was informed that the remainder of the evaluation will be completed by another provider, this initial triage assessment does not replace that evaluation, and the importance of remaining in the ED until their evaluation is complete.    Sean David 10/03/20 1617    Sean Manchester, MD 10/05/20 2351

## 2020-10-04 NOTE — ED Provider Notes (Signed)
[facility] EMERGENCY DEPARTMENT Provider Note   CSN: [id number] Arrival date & time: 10/03/20  1518     History Chief Complaint  Patient presents with   Chest Pain    Sean David is a 65 y.o. male.  HPI     There is a 65 year old male with a history of reflux and hypertension who presents with chest pain.  Patient reports approximately 1 month history of left-sided chest discomfort.  He describes it as pressure.  It is worse when he bends a certain way or presses on it.  He has no radiation of pain.  No shortness of breath or diaphoresis.  No exertional component.  Patient states that prior to onset of symptoms, he did do a lot of heavy lifting and states that he lifted 50 pound bags of concrete.  He is a smoker and has a history of hypertension.  No early family history of heart disease.  Denies recent fevers or illnesses.  Currently he rates his discomfort at 4 out of 10.  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  Social History   Tobacco Use   Smoking status: Former   Smokeless tobacco: Never  Substance Use Topics   Alcohol use: Yes    Comment: occasional beer    Drug use: No    Home Medications Prior to Admission medications   Medication Sig Start Date End Date Taking? Authorizing Provider  ibuprofen (ADVIL) 800 MG tablet Take 800 mg by mouth 3  (three) times daily. 12/10/19   [provider]  lansoprazole (PREVACID) 30 MG capsule Take 30 mg by mouth every morning.    [provider]  pravastatin (PRAVACHOL) 40 MG tablet Take 40 mg by mouth at bedtime.    [provider]    Allergies    Morphine and related  Review of Systems   Review of Systems  Constitutional:  Negative for fever.  Respiratory:  Negative for cough, chest tightness and shortness of breath.   Cardiovascular:  Positive for chest pain. Negative for leg swelling.  Gastrointestinal:  Negative for abdominal pain, nausea and vomiting.  All other systems reviewed and are negative.  Physical Exam Updated Vital Signs BP (!) 142/94 (BP Location: Right Arm)   Pulse 65   Temp 98.2 F (36.8 C) (Oral)   Resp 16   SpO2 98%   Physical Exam Vitals and nursing note reviewed.  Constitutional:      Appearance: He is well-developed. He is not ill-appearing.  HENT:     Head: Normocephalic and atraumatic.  Eyes:     Pupils: Pupils are equal, round, and reactive to light.  Cardiovascular:     Rate and Rhythm: Normal rate and regular rhythm.     Heart sounds: Normal heart sounds. No  murmur heard. Pulmonary:     Effort: Pulmonary effort is normal. No respiratory distress.     Breath sounds: Normal breath sounds. No wheezing.  Chest:     Chest wall: Tenderness present.     Comments: Left chest wall tenderness to palpation, no crepitus Abdominal:     General: Bowel sounds are normal.     Palpations: Abdomen is soft.     Tenderness: There is no abdominal tenderness. There is no rebound.  Musculoskeletal:     Cervical back: Neck supple.  Lymphadenopathy:     Cervical: No cervical adenopathy.  Skin:    General: Skin is warm and dry.  Neurological:     Mental Status: He is alert and oriented to person, place, and time.  Psychiatric:        Mood and Affect: Mood normal.    ED Results / Procedures / Treatments   Labs (all labs ordered are  listed, but only abnormal results are displayed) Labs Reviewed  CBC WITH DIFFERENTIAL/PLATELET - Abnormal; Notable for the following components:      Result Value   Hemoglobin 17.5 (*)    Basophils Absolute 0.2 (*)    All other components within normal limits  BASIC METABOLIC PANEL - Abnormal; Notable for the following components:   Glucose, Bld 104 (*)    All other components within normal limits  TROPONIN I (HIGH SENSITIVITY)  TROPONIN I (HIGH SENSITIVITY)    EKG EKG Interpretation  Date/Time:  Monday October 03 2020 15:58:44 EDT Ventricular Rate:  64 PR Interval:  168 QRS Duration: 90 QT Interval:  386 QTC Calculation: 398 R Axis:   50 Text Interpretation: Sinus rhythm with frequent Premature ventricular complexes Otherwise normal ECG Confirmed by Ross Marcus ([id number]) on 10/04/2020 2:24:31 AM  Radiology DG Chest 2 View  Result Date: 10/03/2020 CLINICAL DATA:  Chest pain EXAM: CHEST - 2 VIEW COMPARISON:  CT 05/24/2011 FINDINGS: The heart size and mediastinal contours are within normal limits. Both lungs are clear. Degenerative changes of the spine. IMPRESSION: No active cardiopulmonary disease. Electronically Signed   By: Jasmine Pang M.D.   On: 10/03/2020 17:38    Procedures Procedures   Medications Ordered in ED Medications - No data to display  ED Course  I have reviewed the triage vital signs and the nursing notes.  Pertinent labs & imaging results that were available during my care of the patient were reviewed by me and considered in my medical decision making (see chart for details).    MDM Rules/Calculators/A&P                           Patient presents with chest discomfort.  He is overall nontoxic and vital signs are reassuring.  He has had chest pain which he describes as constant for 3 to 4 weeks.  He has point tenderness on exam and some of his physical exam findings and history are suggestive of musculoskeletal etiology.  It is fairly atypical for ACS.   He does have some risk factors for ACS.  EKG shows PVCs but otherwise is reassuring.  He reports having a stress test several years ago that was normal.  Troponin x2 negative.  Chest x-ray without pneumothorax or pneumonia.  Highly suspect musculoskeletal etiology.  However, given his risk factors, would recommend follow-up with cardiology as an outpatient.  Regarding his PVCs, there are no significant metabolic derangements noted and he is fairly asymptomatic.  After history, exam,  and medical workup I feel the patient has been appropriately medically screened and is safe for discharge home. Pertinent diagnoses were discussed with the patient. Patient was given return precautions.  Final Clinical Impression(s) / ED Diagnoses Final diagnoses:  Atypical chest pain  PVC (premature ventricular contraction)    Rx / DC Orders ED Discharge Orders     None        Miaya Lafontant, Mayer Masker, MD 10/04/20 [phone]

## 2020-10-04 NOTE — Discharge Instructions (Addendum)
You are seen today for chest pain.  Your pain is fairly atypical and your heart testing is reassuring.  It is importantly follow-up as an outpatient for cardiac evaluation.  Return for any new or worsening symptoms.

## 2020-12-09 ENCOUNTER — Ambulatory Visit: Payer: Medicare Other | Admitting: Cardiology

## 2021-09-28 IMAGING — DX DG ANKLE COMPLETE 3+V*R*
3 series · 3 of 3 positions shown · non-contrast
Comparison: None.

CLINICAL DATA: Medial ankle swelling

EXAM:
RIGHT ANKLE - COMPLETE 3+ VIEW

[ankle ap]
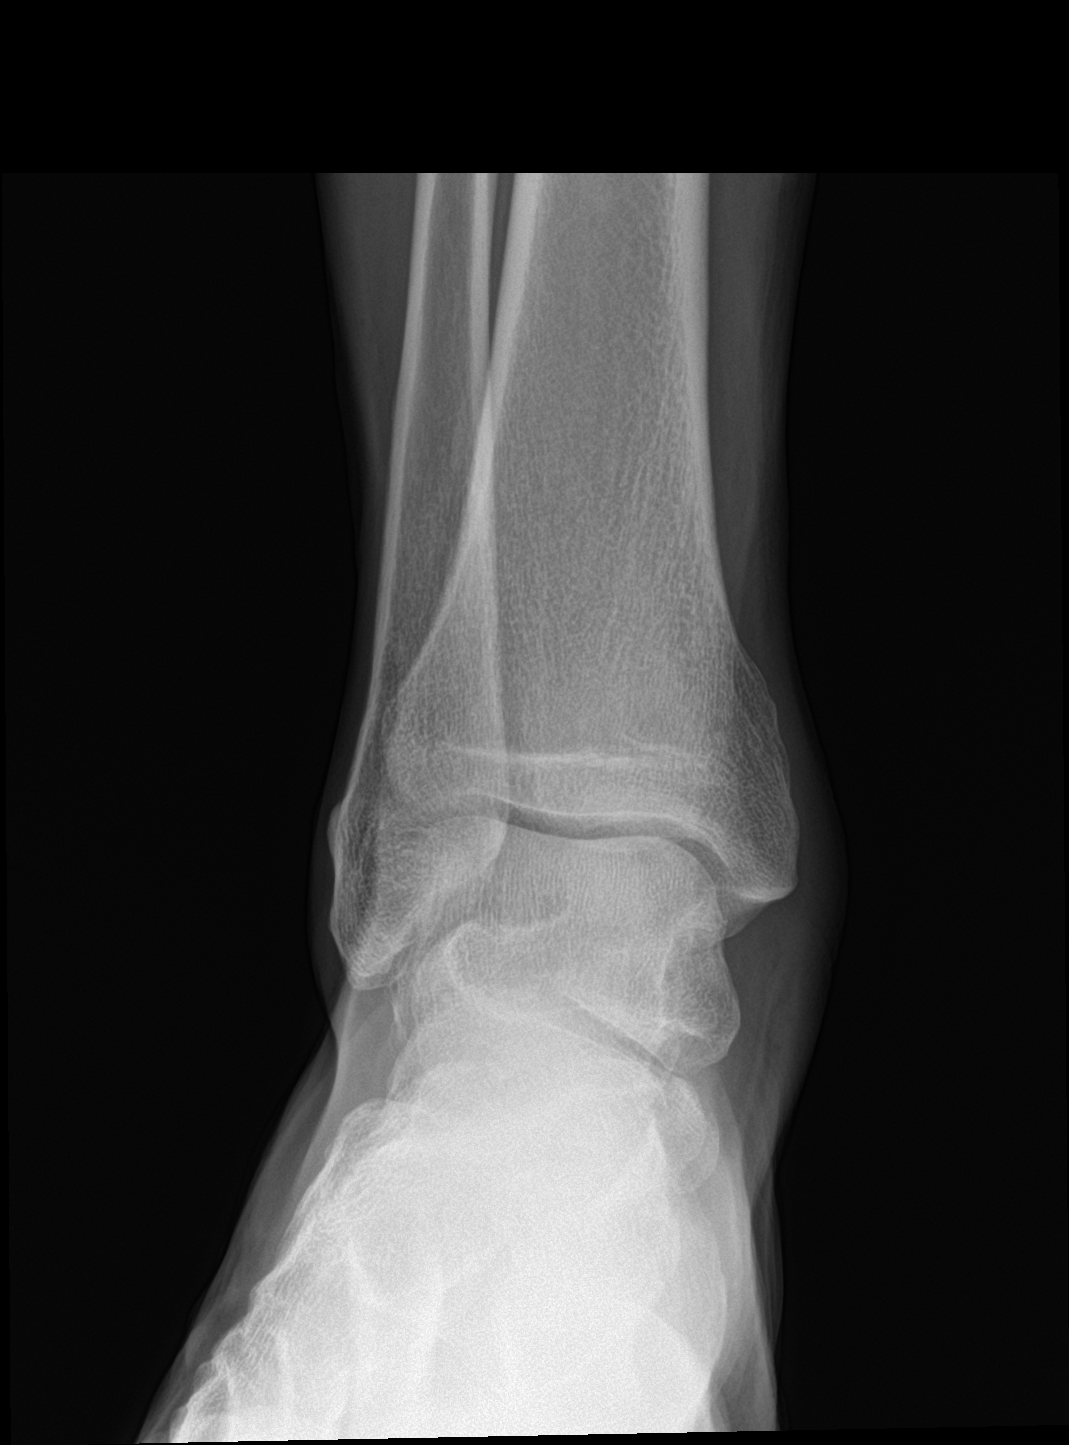

[ankle obl]
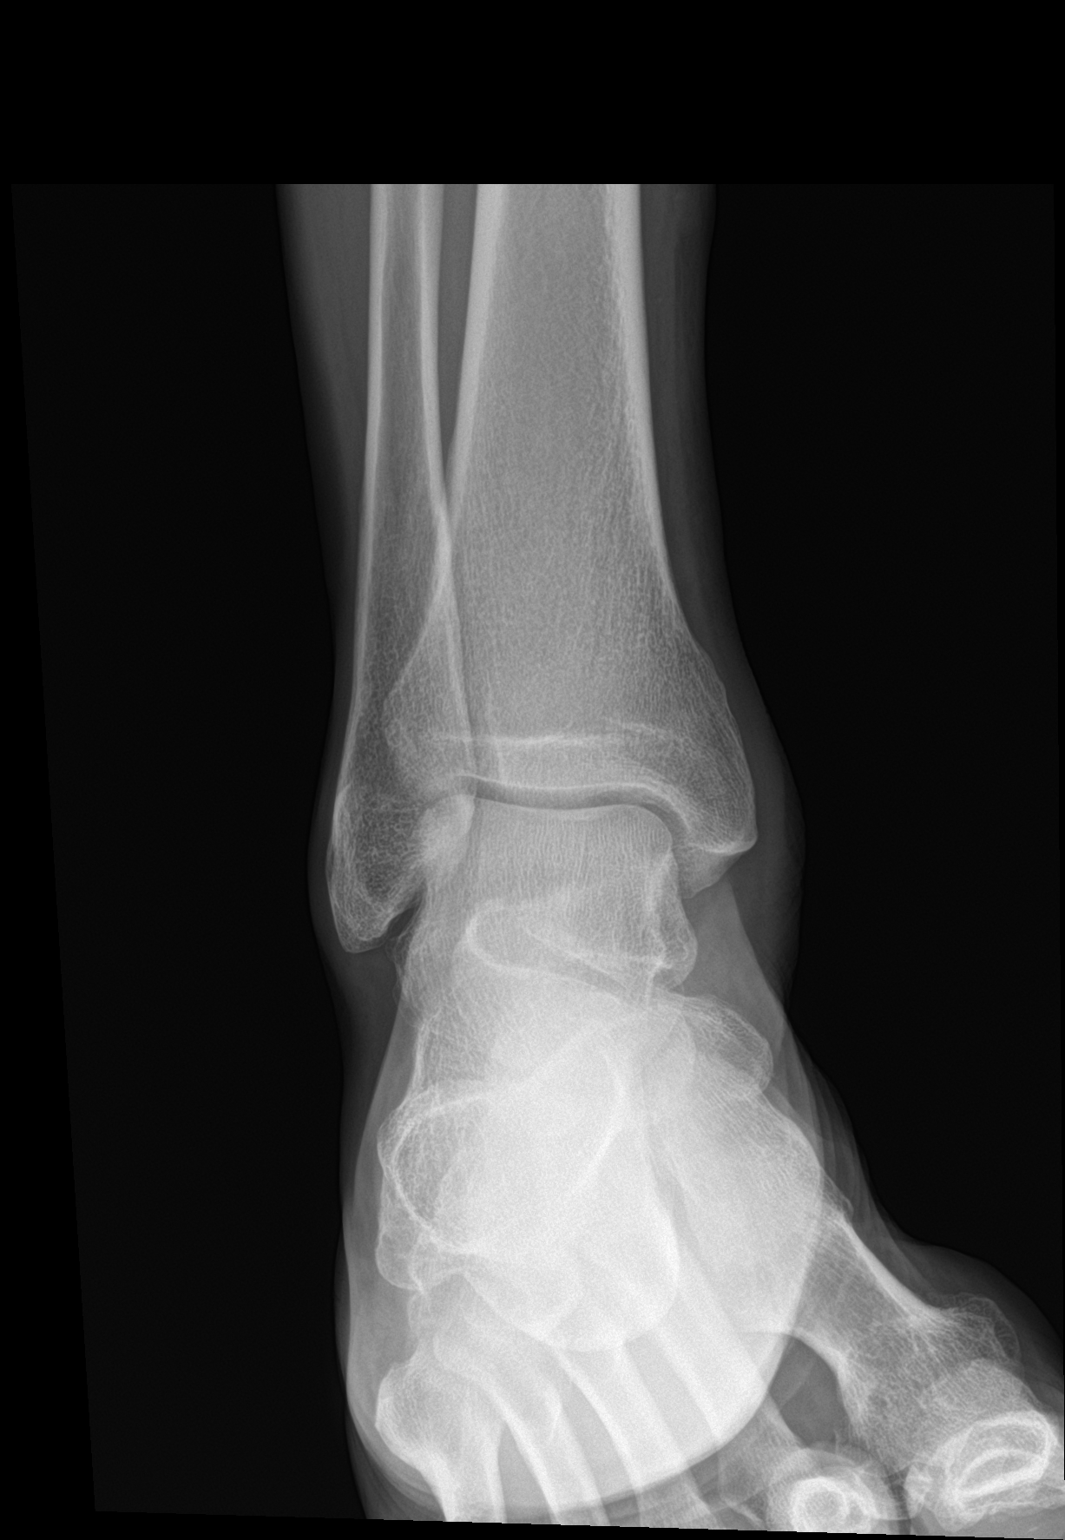

[ankle lat]
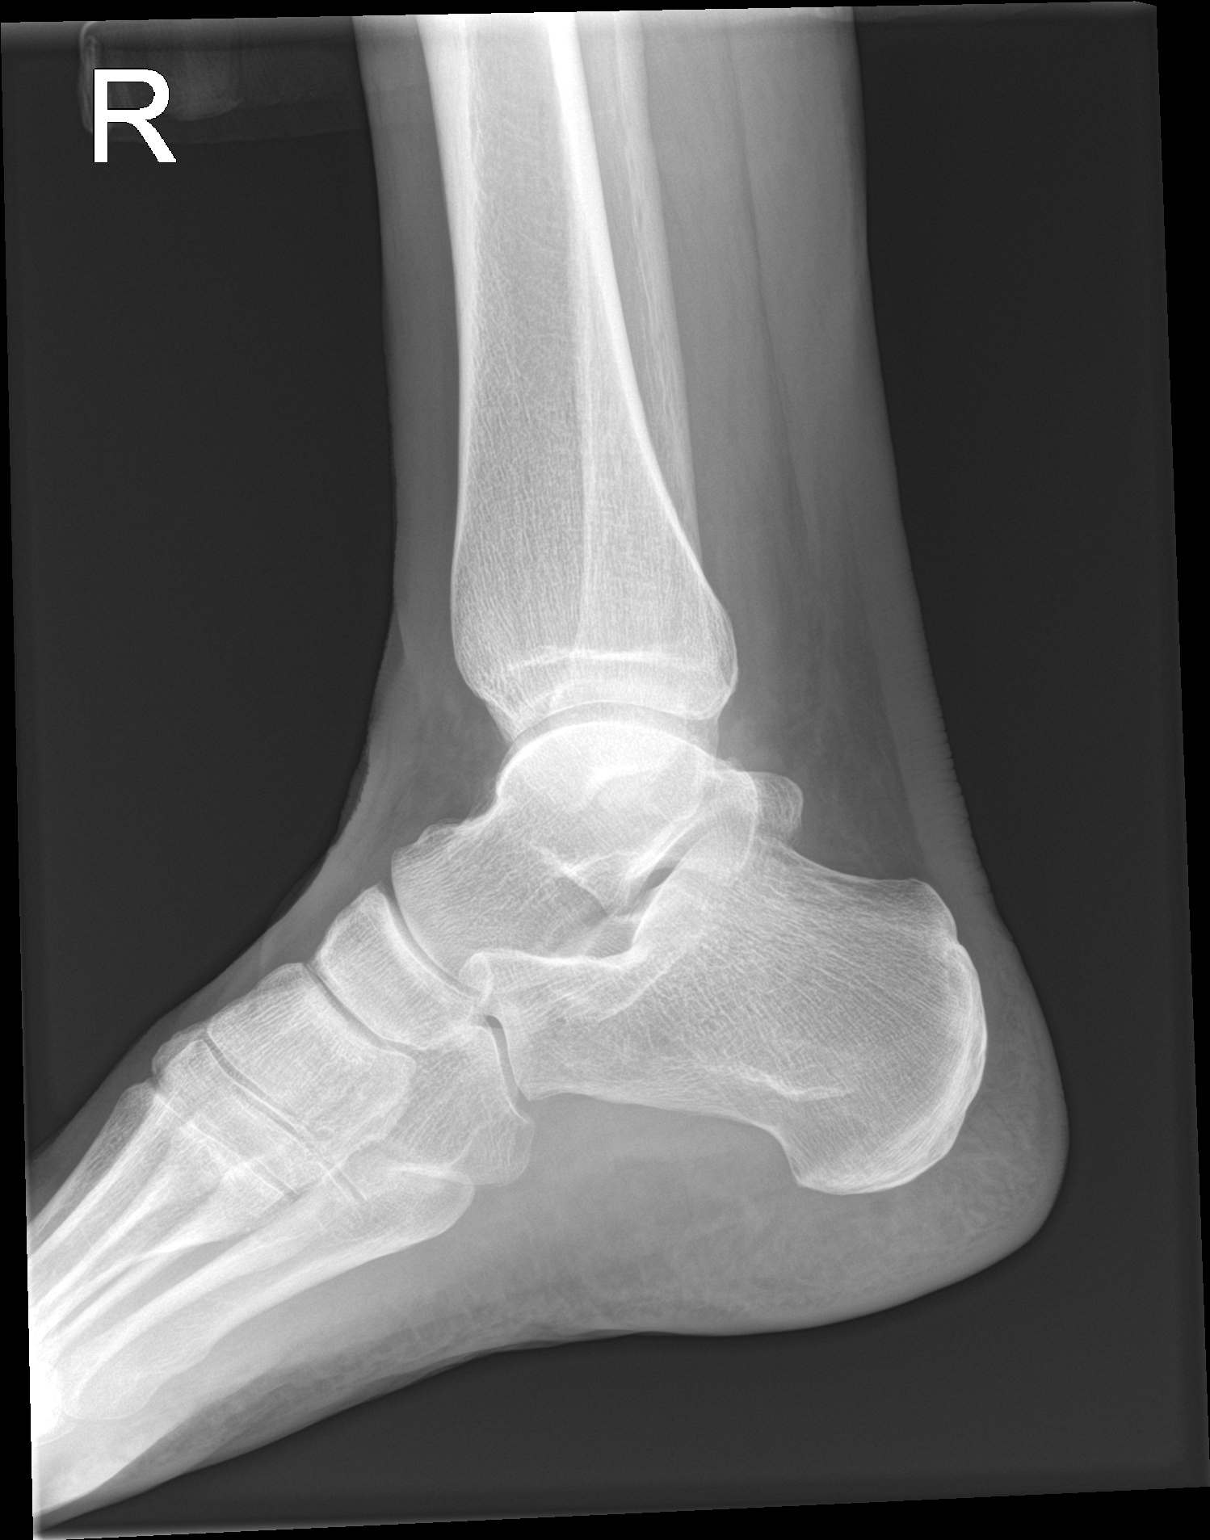

[3 of 3 positions shown; findings below may reference images not displayed]

FINDINGS: Asymmetric soft tissue swelling at the medial malleolus. Alignment
is anatomic. Joint space is preserved.
IMPRESSION: Asymmetric soft tissue swelling at the medial malleolus. No
significant osseous abnormality.
